# Patient Record
Sex: Male | Born: 1980 | Race: Black or African American | Hispanic: No | Marital: Single | Smoking: Current every day smoker
Health system: Southern US, Community
[De-identification: ages and names within clinical notes are randomized; demographics above are authoritative.]

## PROBLEM LIST (undated history)

## (undated) DIAGNOSIS — I10 Essential (primary) hypertension: Secondary | ICD-10-CM

---

## 2003-09-07 ENCOUNTER — Emergency Department (HOSPITAL_COMMUNITY): Admission: EM | Admit: 2003-09-07 | Discharge: 2003-09-07 | Payer: Self-pay | Admitting: Emergency Medicine

## 2008-01-07 ENCOUNTER — Emergency Department (HOSPITAL_COMMUNITY): Admission: EM | Admit: 2008-01-07 | Discharge: 2008-01-07 | Payer: Self-pay | Admitting: Emergency Medicine

## 2017-06-16 ENCOUNTER — Inpatient Hospital Stay (HOSPITAL_COMMUNITY)
Admission: EM | Admit: 2017-06-16 | Discharge: 2017-06-19 | DRG: 305 | Disposition: A | Discharge status: Home / Self Care | Payer: Self-pay | Attending: Internal Medicine | Admitting: Internal Medicine

## 2017-06-16 ENCOUNTER — Encounter (HOSPITAL_COMMUNITY): Payer: Self-pay | Admitting: Emergency Medicine

## 2017-06-16 DIAGNOSIS — Z8249 Family history of ischemic heart disease and other diseases of the circulatory system: Secondary | ICD-10-CM

## 2017-06-16 DIAGNOSIS — R7989 Other specified abnormal findings of blood chemistry: Secondary | ICD-10-CM | POA: Diagnosis present

## 2017-06-16 DIAGNOSIS — F17290 Nicotine dependence, other tobacco product, uncomplicated: Secondary | ICD-10-CM | POA: Diagnosis present

## 2017-06-16 DIAGNOSIS — N183 Chronic kidney disease, stage 3 (moderate): Secondary | ICD-10-CM | POA: Diagnosis present

## 2017-06-16 DIAGNOSIS — N390 Urinary tract infection, site not specified: Secondary | ICD-10-CM | POA: Diagnosis present

## 2017-06-16 DIAGNOSIS — R310 Gross hematuria: Secondary | ICD-10-CM | POA: Diagnosis present

## 2017-06-16 DIAGNOSIS — R319 Hematuria, unspecified: Secondary | ICD-10-CM | POA: Diagnosis present

## 2017-06-16 DIAGNOSIS — R011 Cardiac murmur, unspecified: Secondary | ICD-10-CM | POA: Diagnosis present

## 2017-06-16 DIAGNOSIS — R778 Other specified abnormalities of plasma proteins: Secondary | ICD-10-CM | POA: Diagnosis present

## 2017-06-16 DIAGNOSIS — I129 Hypertensive chronic kidney disease with stage 1 through stage 4 chronic kidney disease, or unspecified chronic kidney disease: Secondary | ICD-10-CM | POA: Diagnosis present

## 2017-06-16 DIAGNOSIS — N289 Disorder of kidney and ureter, unspecified: Secondary | ICD-10-CM

## 2017-06-16 DIAGNOSIS — I248 Other forms of acute ischemic heart disease: Secondary | ICD-10-CM | POA: Diagnosis present

## 2017-06-16 DIAGNOSIS — I161 Hypertensive emergency: Principal | ICD-10-CM | POA: Diagnosis present

## 2017-06-16 DIAGNOSIS — N179 Acute kidney failure, unspecified: Secondary | ICD-10-CM | POA: Diagnosis present

## 2017-06-16 DIAGNOSIS — E876 Hypokalemia: Secondary | ICD-10-CM | POA: Diagnosis present

## 2017-06-16 HISTORY — DX: Essential (primary) hypertension: I10

## 2017-06-16 LAB — URINALYSIS, ROUTINE W REFLEX MICROSCOPIC
Bilirubin Urine: NEGATIVE
GLUCOSE, UA: NEGATIVE mg/dL
Ketones, ur: NEGATIVE mg/dL
Leukocytes, UA: NEGATIVE
NITRITE: NEGATIVE
PH: 5 (ref 5.0–8.0)
Protein, ur: 100 mg/dL — AB
Specific Gravity, Urine: 1.009 (ref 1.005–1.030)
Squamous Epithelial / LPF: NONE SEEN

## 2017-06-16 LAB — CBC WITH DIFFERENTIAL/PLATELET
BASOS PCT: 0 %
Basophils Absolute: 0 10*3/uL (ref 0.0–0.1)
EOS ABS: 0.1 10*3/uL (ref 0.0–0.7)
Eosinophils Relative: 1 %
HEMATOCRIT: 42 % (ref 39.0–52.0)
Hemoglobin: 14.8 g/dL (ref 13.0–17.0)
LYMPHS ABS: 2.7 10*3/uL (ref 0.7–4.0)
Lymphocytes Relative: 42 %
MCH: 30.7 pg (ref 26.0–34.0)
MCHC: 35.2 g/dL (ref 30.0–36.0)
MCV: 87.1 fL (ref 78.0–100.0)
MONO ABS: 0.4 10*3/uL (ref 0.1–1.0)
MONOS PCT: 6 %
Neutro Abs: 3.3 10*3/uL (ref 1.7–7.7)
Neutrophils Relative %: 51 %
Platelets: 216 10*3/uL (ref 150–400)
RBC: 4.82 MIL/uL (ref 4.22–5.81)
RDW: 12.8 % (ref 11.5–15.5)
WBC: 6.5 10*3/uL (ref 4.0–10.5)

## 2017-06-16 MED ORDER — NITROGLYCERIN IN D5W 200-5 MCG/ML-% IV SOLN
0.0000 ug/min | Freq: Once | INTRAVENOUS | Status: AC
  Administered 2017-06-17: 5 ug/min via INTRAVENOUS
  Filled 2017-06-16: qty 250

## 2017-06-16 MED ORDER — CLONIDINE HCL 0.1 MG PO TABS
0.2000 mg | ORAL_TABLET | Freq: Once | ORAL | Status: AC
  Administered 2017-06-16: 0.2 mg via ORAL
  Filled 2017-06-16: qty 2

## 2017-06-16 MED ORDER — HYDRALAZINE HCL 20 MG/ML IJ SOLN
10.0000 mg | Freq: Once | INTRAMUSCULAR | Status: AC
  Administered 2017-06-17: 10 mg via INTRAVENOUS
  Filled 2017-06-16: qty 1

## 2017-06-16 NOTE — ED Triage Notes (Addendum)
Patient c/o one episode of bleeding from penis today. Reports noticing blood on his boxers. Patient hypertensive in triage 244/173. Hx of same. Denies taking BP medications. Denies headache, CP, and SOB.

## 2017-06-16 NOTE — ED Provider Notes (Signed)
[facility]-EMERGENCY DEPT Provider Note   CSN: [id number] Arrival date & time: 06/16/17  2143     History   Chief Complaint Chief Complaint  Patient presents with  . Hypertension  . Penile Discharge    HPI Tyler Gilmore is a 36 y.o. male.  HPI Tyler Gilmore is a 36 y.o. male with history of hypertension, presents to emergency department complaining complaining of bleeding from the penis.  Patient states he has been noticing bright red blood in his boxers mainly in the morning over the last several weeks.  He states he also has some bloody urine when he initially starts to urinate, but then he states it clears up.  He reports some associated clear discharge.  He denies any pain to his penis or his scrotum.  He denies any injuries.  No trauma.  He denies any fever or chills.  He does report intermittent headaches.  Blood pressure was found to be 244/173 with elevated heart rate of 111 in triage.  No any associated symptoms at this time otherwise. Pt reports being told his BP was high 3 years ago, state was give prescription for BP meds which he took for a month but then ran out and never followed up. Does not have PCP. States has been checking BP at home with his own BP machine and it is usually 180s systolic.   Past Medical History:  Diagnosis Date  . Hypertension     There are no active problems to display for this patient.        Home Medications    Prior to Admission medications   Not on File    Family History No family history on file.  Social History Social History   Tobacco Use  . Smoking status: Not on file  Substance Use Topics  . Alcohol use: Not on file  . Drug use: Not on file     Allergies   Patient has no known allergies.   Review of Systems Review of Systems  Constitutional: Negative for chills and fever.  Respiratory: Negative for cough, chest tightness and shortness of breath.   Cardiovascular: Negative for chest  pain, palpitations and leg swelling.  Gastrointestinal: Negative for abdominal distention, abdominal pain, diarrhea, nausea and vomiting.  Genitourinary: Positive for discharge and hematuria. Negative for difficulty urinating, dysuria, frequency, penile pain, penile swelling, scrotal swelling, testicular pain and urgency.  Musculoskeletal: Negative for arthralgias, myalgias, neck pain and neck stiffness.  Skin: Negative for rash.  Allergic/Immunologic: Negative for immunocompromised state.  Neurological: Positive for headaches. Negative for dizziness, weakness, light-headedness and numbness.     Physical Exam Updated Vital Signs BP (!) 244/173 (BP Location: Left Arm)   Pulse (!) 111   Temp 98 F (36.7 C) (Oral)   Resp 18   SpO2 99%   Physical Exam  Constitutional: He appears well-developed and well-nourished. No distress.  HENT:  Head: Normocephalic and atraumatic.  Eyes: Conjunctivae are normal.  Neck: Neck supple.  Cardiovascular: Regular rhythm and normal heart sounds.  tachycardic  Pulmonary/Chest: Effort normal. No respiratory distress. He has no wheezes. He has no rales.  Abdominal: Soft. Bowel sounds are normal. He exhibits no distension. There is no tenderness. There is no rebound.  Musculoskeletal: He exhibits no edema.  Neurological: He is alert.  Skin: Skin is warm and dry.  Nursing note and vitals reviewed.    ED Treatments / Results  Labs (all labs ordered are listed, but only abnormal results  are displayed) Labs Reviewed  URINALYSIS, ROUTINE W REFLEX MICROSCOPIC - Abnormal; Notable for the following components:      Result Value   Color, Urine STRAW (*)    Hgb urine dipstick LARGE (*)    Protein, ur 100 (*)    Bacteria, UA RARE (*)    All other components within normal limits  COMPREHENSIVE METABOLIC PANEL - Abnormal; Notable for the following components:   Potassium 2.7 (*)    Chloride 100 (*)    BUN 31 (*)    Creatinine, Ser 2.77 (*)    GFR calc non  Af Amer 27 (*)    GFR calc Af Amer 32 (*)    All other components within normal limits  TROPONIN I - Abnormal; Notable for the following components:   Troponin I 0.06 (*)    All other components within normal limits  MRSA PCR SCREENING  CBC WITH DIFFERENTIAL/PLATELET  MAGNESIUM  PHOSPHORUS  TROPONIN I  TROPONIN I  TROPONIN I  CBC WITH DIFFERENTIAL/PLATELET  BASIC METABOLIC PANEL  GC/CHLAMYDIA PROBE AMP ([place]) NOT AT [facility]    EKG  EKG Interpretation  Date/Time:  Tuesday June 16 2017 23:48:10 EST Ventricular Rate:  88 PR Interval:    QRS Duration: 103 QT Interval:  378 QTC Calculation: 458 R Axis:   78 Text Interpretation:  Sinus rhythm Biatrial enlargement Left ventricular hypertrophy Abnormal T, consider ischemia, inferior leads Baseline wander in lead(s) V3 No old tracing to compare Confirmed by Rochele RaringWard, Kristen [phone]([id number]) on 06/16/2017 11:51:10 PM       Radiology Dg Chest 2 View  Result Date: 06/17/2017 CLINICAL DATA:  High blood pressure.  Nonsmoker. EXAM: CHEST  2 VIEW COMPARISON:  None. FINDINGS: The heart size and mediastinal contours are within normal limits. Both lungs are clear. The visualized skeletal structures are unremarkable. IMPRESSION: No active cardiopulmonary disease. Electronically Signed   By: Burman NievesWilliam  Stevens M.D.   On: 06/17/2017 00:47    Procedures Procedures (including critical care time)  Medications Ordered in ED Medications  cloNIDine (CATAPRES) tablet 0.2 mg (not administered)     Initial Impression / Assessment and Plan / ED Course  I have reviewed the triage vital signs and the nursing notes.  Pertinent labs & imaging results that were available during my care of the patient were reviewed by me and considered in my medical decision making (see chart for details).     Patient with elevated blood pressure, her blood pressure is 240 systolic.  He is tachycardic.  He is otherwise nontoxic appearing.  Only complaint is intermittent  headaches and hematuria.  Will check basic labs, EKG, urinalysis.  Will treat with clonidine for now.  Patient is EKG with T wave inversions and ST depressions.  Given EKG changes in the last EKG to compare to, will add troponin and will start on IV hydralazine and nitroglycerin drip.  Patient's blood pressure continues to be 240s systolic.   Urinalysis with hematuria, does not appear to be infected.  Creatinine is 2.77.  No last to compare.  Potassium 2.7.  Added potassium p.o. and IV.  Will admit for hypertensive urgency.   Vitals:   06/17/17 0300 06/17/17 0400 06/17/17 0500 06/17/17 0600  BP: (!) 171/122 (!) 156/110 (!) 147/92 (!) 152/109  Pulse: 86 87 88 90  Resp: (!) 28 16 15 15   Temp:      TempSrc:      SpO2: 93% 93% 94% 93%  Weight:      Height:  Final Clinical Impressions(s) / ED Diagnoses   Final diagnoses:  Hypertensive emergency    ED Discharge Orders    None       Jaynie Crumble, PA-C 06/17/17 1610    Ward, Layla Maw, DO 06/17/17 [phone]

## 2017-06-17 ENCOUNTER — Emergency Department (HOSPITAL_COMMUNITY): Payer: Self-pay

## 2017-06-17 ENCOUNTER — Inpatient Hospital Stay (HOSPITAL_COMMUNITY): Payer: Self-pay

## 2017-06-17 ENCOUNTER — Other Ambulatory Visit: Payer: Self-pay

## 2017-06-17 ENCOUNTER — Encounter (HOSPITAL_COMMUNITY): Payer: Self-pay | Admitting: Internal Medicine

## 2017-06-17 DIAGNOSIS — N289 Disorder of kidney and ureter, unspecified: Secondary | ICD-10-CM

## 2017-06-17 DIAGNOSIS — R778 Other specified abnormalities of plasma proteins: Secondary | ICD-10-CM | POA: Diagnosis present

## 2017-06-17 DIAGNOSIS — E876 Hypokalemia: Secondary | ICD-10-CM | POA: Diagnosis present

## 2017-06-17 DIAGNOSIS — I361 Nonrheumatic tricuspid (valve) insufficiency: Secondary | ICD-10-CM

## 2017-06-17 DIAGNOSIS — R7989 Other specified abnormal findings of blood chemistry: Secondary | ICD-10-CM

## 2017-06-17 DIAGNOSIS — R011 Cardiac murmur, unspecified: Secondary | ICD-10-CM | POA: Diagnosis present

## 2017-06-17 DIAGNOSIS — I161 Hypertensive emergency: Principal | ICD-10-CM

## 2017-06-17 DIAGNOSIS — R319 Hematuria, unspecified: Secondary | ICD-10-CM | POA: Diagnosis present

## 2017-06-17 DIAGNOSIS — R748 Abnormal levels of other serum enzymes: Secondary | ICD-10-CM

## 2017-06-17 LAB — ECHOCARDIOGRAM COMPLETE
Height: 66 in
Weight: 3068.8 oz

## 2017-06-17 LAB — GC/CHLAMYDIA PROBE AMP (~~LOC~~) NOT AT ARMC
Chlamydia: POSITIVE — AB
Neisseria Gonorrhea: NEGATIVE

## 2017-06-17 LAB — CBC WITH DIFFERENTIAL/PLATELET
Basophils Absolute: 0 10*3/uL (ref 0.0–0.1)
Basophils Relative: 0 %
EOS ABS: 0 10*3/uL (ref 0.0–0.7)
EOS PCT: 0 %
HCT: 35.7 % — ABNORMAL LOW (ref 39.0–52.0)
Hemoglobin: 12 g/dL — ABNORMAL LOW (ref 13.0–17.0)
LYMPHS ABS: 0.9 10*3/uL (ref 0.7–4.0)
Lymphocytes Relative: 12 %
MCH: 29.6 pg (ref 26.0–34.0)
MCHC: 33.6 g/dL (ref 30.0–36.0)
MCV: 87.9 fL (ref 78.0–100.0)
MONOS PCT: 5 %
Monocytes Absolute: 0.3 10*3/uL (ref 0.1–1.0)
Neutro Abs: 6 10*3/uL (ref 1.7–7.7)
Neutrophils Relative %: 83 %
PLATELETS: 195 10*3/uL (ref 150–400)
RBC: 4.06 MIL/uL — AB (ref 4.22–5.81)
RDW: 12.9 % (ref 11.5–15.5)
WBC: 7.2 10*3/uL (ref 4.0–10.5)

## 2017-06-17 LAB — BASIC METABOLIC PANEL
Anion gap: 7 (ref 5–15)
BUN: 31 mg/dL — AB (ref 6–20)
CALCIUM: 8.6 mg/dL — AB (ref 8.9–10.3)
CO2: 24 mmol/L (ref 22–32)
CREATININE: 2.7 mg/dL — AB (ref 0.61–1.24)
Chloride: 103 mmol/L (ref 101–111)
GFR calc Af Amer: 33 mL/min — ABNORMAL LOW (ref >60)
GFR, EST NON AFRICAN AMERICAN: 28 mL/min — AB (ref >60)
Glucose, Bld: 140 mg/dL — ABNORMAL HIGH (ref 65–99)
Potassium: 3.5 mmol/L (ref 3.5–5.1)
SODIUM: 134 mmol/L — AB (ref 135–145)

## 2017-06-17 LAB — COMPREHENSIVE METABOLIC PANEL
ALBUMIN: 3.9 g/dL (ref 3.5–5.0)
ALT: 28 U/L (ref 17–63)
ANION GAP: 12 (ref 5–15)
AST: 34 U/L (ref 15–41)
Alkaline Phosphatase: 69 U/L (ref 38–126)
BUN: 31 mg/dL — ABNORMAL HIGH (ref 6–20)
CO2: 23 mmol/L (ref 22–32)
Calcium: 9.2 mg/dL (ref 8.9–10.3)
Chloride: 100 mmol/L — ABNORMAL LOW (ref 101–111)
Creatinine, Ser: 2.77 mg/dL — ABNORMAL HIGH (ref 0.61–1.24)
GFR calc Af Amer: 32 mL/min — ABNORMAL LOW (ref >60)
GFR calc non Af Amer: 27 mL/min — ABNORMAL LOW (ref >60)
GLUCOSE: 87 mg/dL (ref 65–99)
POTASSIUM: 2.7 mmol/L — AB (ref 3.5–5.1)
SODIUM: 135 mmol/L (ref 135–145)
Total Bilirubin: 0.4 mg/dL (ref 0.3–1.2)
Total Protein: 7.3 g/dL (ref 6.5–8.1)

## 2017-06-17 LAB — MRSA PCR SCREENING: MRSA BY PCR: NEGATIVE

## 2017-06-17 LAB — TROPONIN I
TROPONIN I: 0.06 ng/mL — AB (ref <0.03)
Troponin I: 0.06 ng/mL (ref <0.03)
Troponin I: 0.09 ng/mL (ref <0.03)

## 2017-06-17 LAB — SEDIMENTATION RATE: Sed Rate: 9 mm/hr (ref 0–16)

## 2017-06-17 LAB — MAGNESIUM: MAGNESIUM: 2.2 mg/dL (ref 1.7–2.4)

## 2017-06-17 LAB — PHOSPHORUS: PHOSPHORUS: 3.3 mg/dL (ref 2.5–4.6)

## 2017-06-17 MED ORDER — ALPRAZOLAM 0.5 MG PO TABS: 0.5000 mg | ORAL_TABLET | Freq: Every evening | ORAL | Status: DC | PRN

## 2017-06-17 MED ORDER — ONDANSETRON HCL 4 MG/2ML IJ SOLN: 4.0000 mg | Freq: Four times a day (QID) | INTRAMUSCULAR | Status: DC | PRN

## 2017-06-17 MED ORDER — CLONIDINE HCL 0.1 MG PO TABS
0.2000 mg | ORAL_TABLET | Freq: Two times a day (BID) | ORAL | Status: DC
  Administered 2017-06-17 – 2017-06-18 (×3): 0.2 mg via ORAL
  Filled 2017-06-17 (×3): qty 2

## 2017-06-17 MED ORDER — AMLODIPINE BESYLATE 10 MG PO TABS
10.0000 mg | ORAL_TABLET | Freq: Every day | ORAL | Status: DC
  Administered 2017-06-17 – 2017-06-19 (×3): 10 mg via ORAL
  Filled 2017-06-17 (×3): qty 1

## 2017-06-17 MED ORDER — HYDRALAZINE HCL 20 MG/ML IJ SOLN
10.0000 mg | Freq: Four times a day (QID) | INTRAMUSCULAR | Status: DC | PRN
  Administered 2017-06-18 – 2017-06-19 (×4): 10 mg via INTRAVENOUS
  Filled 2017-06-17 (×4): qty 1

## 2017-06-17 MED ORDER — POTASSIUM CHLORIDE 10 MEQ/100ML IV SOLN
10.0000 meq | INTRAVENOUS | Status: AC
  Administered 2017-06-17 (×2): 10 meq via INTRAVENOUS
  Filled 2017-06-17 (×2): qty 100

## 2017-06-17 MED ORDER — SPIRONOLACTONE 25 MG PO TABS
25.0000 mg | ORAL_TABLET | Freq: Two times a day (BID) | ORAL | Status: DC
  Administered 2017-06-17 – 2017-06-19 (×4): 25 mg via ORAL
  Filled 2017-06-17 (×4): qty 1

## 2017-06-17 MED ORDER — ONDANSETRON HCL 4 MG PO TABS: 4.0000 mg | ORAL_TABLET | Freq: Four times a day (QID) | ORAL | Status: DC | PRN

## 2017-06-17 MED ORDER — POTASSIUM CHLORIDE CRYS ER 20 MEQ PO TBCR
40.0000 meq | EXTENDED_RELEASE_TABLET | Freq: Once | ORAL | Status: AC
  Administered 2017-06-17: 40 meq via ORAL
  Filled 2017-06-17: qty 2

## 2017-06-17 MED ORDER — NITROGLYCERIN IN D5W 200-5 MCG/ML-% IV SOLN: 0.0000 ug/min | Freq: Once | INTRAVENOUS | Status: DC

## 2017-06-17 MED ORDER — ASPIRIN EC 325 MG PO TBEC
325.0000 mg | DELAYED_RELEASE_TABLET | Freq: Once | ORAL | Status: AC
  Administered 2017-06-17: 325 mg via ORAL
  Filled 2017-06-17: qty 1

## 2017-06-17 NOTE — ED Notes (Signed)
New room 1237

## 2017-06-17 NOTE — Progress Notes (Signed)
PROGRESS NOTE    Tyler Gilmore  WUJ:[id number] DOB: 03/17/1981 DOA: 06/16/2017 PCP: Patient, No Pcp Per  Brief Narrative:Trek Tyler Gilmore is a 36 y.o. male with medical history significant of hypertension, diagnosed 3 years ago and not on medical therapy who comes in the emergency department complaining of bloody discharge from his urethra seen earlier today and also has noticed bright red blood in his urine for the past 2-3 weeks. In ED, blood pressure 244/173 mmHg, workup shows a urinalysis with large hematuria, proteinuria over 100 mg/dL and creatinine 9.562.77 mg/dL. EKG showed LVH  Assessment & Plan:     Hypertensive emergency - Patient has long-standing uncontrolled hypertension, for at least 3 years -Now off nitro drip -Add oral amlodipine, and clonidine, continue when necessary IV hydralazine -Echocardiogram with normal ejection fraction and elevated end-diastolic and systolic pressures   Hematuria -Etiology unclear, could possibly be due to hypertensive emergency and progression of kidney disease -Nephritic component not excluded -Will ask renal for input -Renal ultrasound suggestive of medical renal disease no structural abnormalities to account for hematuria   Progressive AKI -His creatinine [facility] 1.86 [facility] -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 at least due to uncontrolled hypertension -Monitor creatinine   Elevated troponin -Due to increased demand from hypertensive emergency -Troponin flat trend, no evidence of ACS -2-D echocardiogram with normal ejection fraction and wall motion-  DVT prophylaxis: SCDs due to hematuria Code Status: Full code Family Communication: Friend at bedside Disposition Plan: Home in 1-2 days of stable  Consultants:   Nephrology   Procedures:   Antimicrobials:    Subjective: -Feels okay, mild headache  Objective: Vitals:   06/17/17 0800 06/17/17 0807 06/17/17  0809 06/17/17 0900  BP: (!) 174/112 (!) 191/126 (!) 187/119 (!) 172/95  Pulse: 93 91 88 98  Resp:  18 (!) 22 (!) 21  Temp:      TempSrc:      SpO2: 95% 96% 94% 94%  Weight:      Height:        Intake/Output Summary (Last 24 hours) at 06/17/2017 1150 Last data filed at 06/17/2017 0826 Gross per 24 hour  Intake 338 ml  Output 550 ml  Net -212 ml   Filed Weights   06/17/17 0118 06/17/17 0227  Weight: 87.1 kg (192 lb) 87 kg (191 lb 12.8 oz)    Examination:  General exam: Appears calm and comfortable, well-built male, no distress Respiratory system: Clear to auscultation. Respiratory effort normal. Cardiovascular system: S1 & S2 heard, RRR. Systolic murmur appreciated Gastrointestinal system: Abdomen is nondistended, soft and nontender.Normal bowel sounds heard. Central nervous system: Alert and oriented. No focal neurological deficits. Extremities: Symmetric 5 x 5 power. Skin: No rashes, lesions or ulcers Psychiatry: Judgement and insight appear normal. Mood & affect appropriate.     Data Reviewed:   CBC: Recent Labs  Lab 06/16/17 2332 06/17/17 1125  WBC 6.5 7.2  NEUTROABS 3.3 6.0  HGB 14.8 12.0*  HCT 42.0 35.7*  MCV 87.1 87.9  PLT 216 195   Basic Metabolic Panel: Recent Labs  Lab 06/16/17 2332 06/16/17 2342  NA 135  --   K 2.7*  --   CL 100*  --   CO2 23  --   GLUCOSE 87  --   BUN 31*  --   CREATININE 2.77*  --   CALCIUM 9.2  --   MG  --  2.2  PHOS  --  3.3   GFR: Estimated Creatinine Clearance: 37.4 mL/min (A) (by C-G formula based on SCr of 2.77 mg/dL (H)). Liver Function Tests: Recent Labs  Lab 06/16/17 2332  AST 34  ALT 28  ALKPHOS 69  BILITOT 0.4  PROT 7.3  ALBUMIN 3.9   No results for input(s): LIPASE, AMYLASE in the last 168 hours. No results for input(s): AMMONIA in the last 168 hours. Coagulation Profile: No results for input(s): INR, PROTIME in the last 168 hours. Cardiac Enzymes: Recent Labs  Lab 06/16/17 2330  06/17/17 0619  TROPONINI 0.06* 0.06*   BNP (last 3 results) No results for input(s): PROBNP in the last 8760 hours. HbA1C: No results for input(s): HGBA1C in the last 72 hours. CBG: No results for input(s): GLUCAP in the last 168 hours. Lipid Profile: No results for input(s): CHOL, HDL, LDLCALC, TRIG, CHOLHDL, LDLDIRECT in the last 72 hours. Thyroid Function Tests: No results for input(s): TSH, T4TOTAL, FREET4, T3FREE, THYROIDAB in the last 72 hours. Anemia Panel: No results for input(s): VITAMINB12, FOLATE, FERRITIN, TIBC, IRON, RETICCTPCT in the last 72 hours. Urine analysis:    Component Value Date/Time   COLORURINE STRAW (A) 06/16/2017 2332   APPEARANCEUR CLEAR 06/16/2017 2332   LABSPEC 1.009 06/16/2017 2332   PHURINE 5.0 06/16/2017 2332   GLUCOSEU NEGATIVE 06/16/2017 2332   HGBUR LARGE (A) 06/16/2017 2332   BILIRUBINUR NEGATIVE 06/16/2017 2332   KETONESUR NEGATIVE 06/16/2017 2332   PROTEINUR 100 (A) 06/16/2017 2332   NITRITE NEGATIVE 06/16/2017 2332   LEUKOCYTESUR NEGATIVE 06/16/2017 2332   Sepsis Labs: @LABRCNTIP (procalcitonin:4,lacticidven:4)  ) Recent Results (from the past 240 hour(s))  MRSA PCR Screening     Status: None   Collection Time: 06/17/17  2:21 AM  Result Value Ref Range Status   MRSA by PCR NEGATIVE NEGATIVE Final    Comment:        The GeneXpert MRSA Assay (FDA approved for NASAL specimens only), is one component of a comprehensive MRSA colonization surveillance program. It is not intended to diagnose MRSA infection nor to guide or monitor treatment for MRSA infections.          Radiology Studies: Dg Chest 2 View  Result Date: 06/17/2017 CLINICAL DATA:  High blood pressure.  Nonsmoker. EXAM: CHEST  2 VIEW COMPARISON:  None. FINDINGS: The heart size and mediastinal contours are within normal limits. Both lungs are clear. The visualized skeletal structures are unremarkable. IMPRESSION: No active cardiopulmonary disease. Electronically  Signed   By: Burman NievesWilliam  Stevens M.D.   On: 06/17/2017 00:47   [place] Renal  Result Date: 06/17/2017 CLINICAL DATA:  Abnormal renal function. EXAM: RENAL / URINARY TRACT ULTRASOUND COMPLETE COMPARISON:  None. FINDINGS: Right Kidney: Length: 10.4 cm. Increased echogenicity of renal parenchyma is noted. No mass or hydronephrosis visualized. Left Kidney: Length: 10.4 cm. Increased echogenicity of renal parenchyma is noted. No mass or hydronephrosis visualized. Bladder: Appears normal for degree of bladder distention. IMPRESSION: Increased echogenicity of renal parenchyma is noted suggesting medical renal disease. No hydronephrosis or renal obstruction is noted. Electronically Signed   By: Lupita RaiderJames  Green Jr, M.D.   On: 06/17/2017 09:26        Scheduled Meds: . amLODipine  10 mg Oral Daily   Continuous Infusions:   LOS: 0 days    Time spent: 35min    Zannie CovePreetha Ameliarose Shark, MD [facility] Page via [email or web address], password [email or web address] After 7PM please contact night-coverage  06/17/2017, 11:50 AM

## 2017-06-17 NOTE — Progress Notes (Signed)
  Echocardiogram 2D Echocardiogram has been performed.  Leta JunglingCooper, [place] M 06/17/2017, 9:10 AM

## 2017-06-17 NOTE — Plan of Care (Signed)
  Progressing Education: Knowledge of General Education information will improve 06/17/2017 1855 - Progressing by Burna SisZavaleta Catalan, Sanvi Ehler G, RN Health Behavior/Discharge Planning: Ability to manage health-related needs will improve 06/17/2017 1855 - Progressing by Burna SisZavaleta Catalan, Haliegh Khurana G, RN Clinical Measurements: Ability to maintain clinical measurements within normal limits will improve 06/17/2017 1855 - Progressing by Burna SisZavaleta Catalan, Chidinma Clites G, RN Will remain free from infection 06/17/2017 1855 - Progressing by Burna SisZavaleta Catalan, Whitt Auletta G, RN Respiratory complications will improve 06/17/2017 1855 - Progressing by Burna SisZavaleta Catalan, Taylour Lietzke G, RN Cardiovascular complication will be avoided 06/17/2017 1855 - Progressing by Burna SisZavaleta Catalan, Ramone Gander G, RN Activity: Risk for activity intolerance will decrease 06/17/2017 1855 - Progressing by Burna SisZavaleta Catalan, Jamesyn Lindell G, RN Nutrition: Adequate nutrition will be maintained 06/17/2017 1855 - Progressing by Burna SisZavaleta Catalan, Jacques Fife G, RN Coping: Level of anxiety will decrease 06/17/2017 1855 - Progressing by Burna SisZavaleta Catalan, Ladonya Jerkins G, RN Elimination: Will not experience complications related to bowel motility 06/17/2017 1855 - Progressing by Burna SisZavaleta Catalan, Kanan Sobek G, RN Will not experience complications related to urinary retention 06/17/2017 1855 - Progressing by Burna SisZavaleta Catalan, Mishayla Sliwinski G, RN Pain Managment: General experience of comfort will improve 06/17/2017 1855 - Progressing by Burna SisZavaleta Catalan, Aidynn Polendo G, RN Safety: Ability to remain free from injury will improve 06/17/2017 1855 - Progressing by Burna SisZavaleta Catalan, Kaylem Gidney G, RN Skin Integrity: Risk for impaired skin integrity will decrease 06/17/2017 1855 - Progressing by Burna SisZavaleta Catalan, Rima Blizzard G, RN   Not Progressing Clinical Measurements: Diagnostic test results will improve 06/17/2017 1855 - Not Progressing by Burna SisZavaleta Catalan, Christabelle Hanzlik G, RN

## 2017-06-17 NOTE — Consult Note (Signed)
[facility] Renal Consultation Note  Requesting MD: Fanny Bien Indication for Consultation: elevated creatinine and gross hematuria   HPI:  Tyler Gilmore is a 36 y.o. male with past medical history significant for hypertension. Apparently, diagnosed about 3 years ago but h a well as not been on consistent medical therapy mostly due to financial reasons. Also of note, patient was noted to have a creatinine of 1.86 with an seen in in the emergency room in July 2015. He presented to the hospital last night with complaint of gross hematuria which he states he's had for the last 2-3 weeks. Stream starting with bright red blood then it fades. He has never noticed this before. There is no family history of kidney disease that he knows of. He tells me that he's had daily headaches and takes BC's (goody powders)  daily and thinks that he has done so  for the last 8 years !  His presenting creatinine was 2.77, 2.7 approximately 12 hours later. Potassium also was low at 2.7. Renal ultrasound shows 10.4 cm kidneys, increased echogenicity, no evidence of stone or Hydro.  UA with too numerous to count red, 100 protein.  His serum albumin is 3.9 and he has no edema.  Other than the gross hematuria and headaches he has been well  Creatinine, Ser  Date/Time Value Ref Range Status  06/17/2017 11:25 AM 2.70 (H) 0.61 - 1.24 mg/dL Final  06/16/2017 11:32 PM 2.77 (H) 0.61 - 1.24 mg/dL Final     PMHx:   Past Medical History:  Diagnosis Date  . Hypertension     History reviewed. No pertinent surgical history.  Family Hx:  Family History  Problem Relation Age of Onset  . Hypertension Mother   . Hypertension Maternal Grandmother   . Hypertension Maternal Aunt     Social History:  reports that he has been smoking cigars.  he has never used smokeless tobacco. He reports that he drinks about 0.6 oz of alcohol per week. His drug history is not on file.  Allergies: No Known  Allergies  Medications: Prior to Admission medications   Medication Sig Start Date End Date Taking? Authorizing Provider  Aspirin-Salicylamide-Caffeine (BC HEADACHE POWDER PO) Take 1 packet by mouth daily as needed (headache).   Yes [provider]  ibuprofen (ADVIL,MOTRIN) 200 MG tablet Take 400 mg by mouth every 8 (eight) hours as needed for headache.   Yes [provider]    I have reviewed the patient's current medications.  Labs:  Results for orders placed or performed during the hospital encounter of 06/16/17 (from the past 48 hour(s))  Troponin I     Status: Abnormal   Collection Time: 06/16/17 11:30 PM  Result Value Ref Range   Troponin I 0.06 (HH) <0.03 ng/mL    Comment: CRITICAL RESULT CALLED TO, READ BACK BY AND VERIFIED WITH: NASH,J RN 11.21.18 @0023  ZANDO,C   Urinalysis, Routine w reflex microscopic     Status: Abnormal   Collection Time: 06/16/17 11:32 PM  Result Value Ref Range   Color, Urine STRAW (A) YELLOW   APPearance CLEAR CLEAR   Specific Gravity, Urine 1.009 1.005 - 1.030   pH 5.0 5.0 - 8.0   Glucose, UA NEGATIVE NEGATIVE mg/dL   Hgb urine dipstick LARGE (A) NEGATIVE   Bilirubin Urine NEGATIVE NEGATIVE   Ketones, ur NEGATIVE NEGATIVE mg/dL   Protein, ur 100 (A) NEGATIVE mg/dL   Nitrite NEGATIVE NEGATIVE   Leukocytes, UA NEGATIVE NEGATIVE   RBC /  HPF TOO NUMEROUS TO COUNT 0 - 5 RBC/hpf   WBC, UA 6-30 0 - 5 WBC/hpf   Bacteria, UA RARE (A) NONE SEEN   Squamous Epithelial / LPF NONE SEEN NONE SEEN   Mucus PRESENT   CBC with Differential     Status: None   Collection Time: 06/16/17 11:32 PM  Result Value Ref Range   WBC 6.5 4.0 - 10.5 K/uL   RBC 4.82 4.22 - 5.81 MIL/uL   Hemoglobin 14.8 13.0 - 17.0 g/dL   HCT 42.0 39.0 - 52.0 %   MCV 87.1 78.0 - 100.0 fL   MCH 30.7 26.0 - 34.0 pg   MCHC 35.2 30.0 - 36.0 g/dL   RDW 12.8 11.5 - 15.5 %   Platelets 216 150 - 400 K/uL   Neutrophils Relative % 51 %   Neutro Abs 3.3 1.7 - 7.7 K/uL    Lymphocytes Relative 42 %   Lymphs Abs 2.7 0.7 - 4.0 K/uL   Monocytes Relative 6 %   Monocytes Absolute 0.4 0.1 - 1.0 K/uL   Eosinophils Relative 1 %   Eosinophils Absolute 0.1 0.0 - 0.7 K/uL   Basophils Relative 0 %   Basophils Absolute 0.0 0.0 - 0.1 K/uL  Comprehensive metabolic panel     Status: Abnormal   Collection Time: 06/16/17 11:32 PM  Result Value Ref Range   Sodium 135 135 - 145 mmol/L   Potassium 2.7 (LL) 3.5 - 5.1 mmol/L    Comment: REPEATED TO VERIFY CRITICAL RESULT CALLED TO, READ BACK BY AND VERIFIED WITH: NASH,J RN 11.21.18 @0024  ZANDO,C    Chloride 100 (L) 101 - 111 mmol/L   CO2 23 22 - 32 mmol/L   Glucose, Bld 87 65 - 99 mg/dL   BUN 31 (H) 6 - 20 mg/dL   Creatinine, Ser 2.77 (H) 0.61 - 1.24 mg/dL   Calcium 9.2 8.9 - 10.3 mg/dL   Total Protein 7.3 6.5 - 8.1 g/dL   Albumin 3.9 3.5 - 5.0 g/dL   AST 34 15 - 41 U/L   ALT 28 17 - 63 U/L   Alkaline Phosphatase 69 38 - 126 U/L   Total Bilirubin 0.4 0.3 - 1.2 mg/dL   GFR calc non Af Amer 27 (L) >60 mL/min   GFR calc Af Amer 32 (L) >60 mL/min    Comment: (NOTE) The eGFR has been calculated using the CKD EPI equation. This calculation has not been validated in all clinical situations. eGFR's persistently <60 mL/min signify possible Chronic Kidney Disease.    Anion gap 12 5 - 15  Magnesium     Status: None   Collection Time: 06/16/17 11:42 PM  Result Value Ref Range   Magnesium 2.2 1.7 - 2.4 mg/dL  Phosphorus     Status: None   Collection Time: 06/16/17 11:42 PM  Result Value Ref Range   Phosphorus 3.3 2.5 - 4.6 mg/dL  MRSA PCR Screening     Status: None   Collection Time: 06/17/17  2:21 AM  Result Value Ref Range   MRSA by PCR NEGATIVE NEGATIVE    Comment:        The GeneXpert MRSA Assay (FDA approved for NASAL specimens only), is one component of a comprehensive MRSA colonization surveillance program. It is not intended to diagnose MRSA infection nor to guide or monitor treatment for MRSA  infections.   Troponin I (q 6hr x 3)     Status: Abnormal   Collection Time: 06/17/17  6:19 AM  Result Value Ref Range   Troponin I 0.06 (HH) <0.03 ng/mL    Comment: CRITICAL VALUE NOTED.  VALUE IS CONSISTENT WITH PREVIOUSLY REPORTED AND CALLED VALUE.  Troponin I (q 6hr x 3)     Status: Abnormal   Collection Time: 06/17/17 11:25 AM  Result Value Ref Range   Troponin I 0.09 (HH) <0.03 ng/mL    Comment: CRITICAL VALUE NOTED.  VALUE IS CONSISTENT WITH PREVIOUSLY REPORTED AND CALLED VALUE.  CBC WITH DIFFERENTIAL     Status: Abnormal   Collection Time: 06/17/17 11:25 AM  Result Value Ref Range   WBC 7.2 4.0 - 10.5 K/uL   RBC 4.06 (L) 4.22 - 5.81 MIL/uL   Hemoglobin 12.0 (L) 13.0 - 17.0 g/dL   HCT 35.7 (L) 39.0 - 52.0 %   MCV 87.9 78.0 - 100.0 fL   MCH 29.6 26.0 - 34.0 pg   MCHC 33.6 30.0 - 36.0 g/dL   RDW 12.9 11.5 - 15.5 %   Platelets 195 150 - 400 K/uL   Neutrophils Relative % 83 %   Neutro Abs 6.0 1.7 - 7.7 K/uL   Lymphocytes Relative 12 %   Lymphs Abs 0.9 0.7 - 4.0 K/uL   Monocytes Relative 5 %   Monocytes Absolute 0.3 0.1 - 1.0 K/uL   Eosinophils Relative 0 %   Eosinophils Absolute 0.0 0.0 - 0.7 K/uL   Basophils Relative 0 %   Basophils Absolute 0.0 0.0 - 0.1 K/uL  Basic metabolic panel     Status: Abnormal   Collection Time: 06/17/17 11:25 AM  Result Value Ref Range   Sodium 134 (L) 135 - 145 mmol/L   Potassium 3.5 3.5 - 5.1 mmol/L    Comment: DELTA CHECK NOTED NO VISIBLE HEMOLYSIS REPEATED TO VERIFY    Chloride 103 101 - 111 mmol/L   CO2 24 22 - 32 mmol/L   Glucose, Bld 140 (H) 65 - 99 mg/dL   BUN 31 (H) 6 - 20 mg/dL   Creatinine, Ser 2.70 (H) 0.61 - 1.24 mg/dL   Calcium 8.6 (L) 8.9 - 10.3 mg/dL   GFR calc non Af Amer 28 (L) >60 mL/min   GFR calc Af Amer 33 (L) >60 mL/min    Comment: (NOTE) The eGFR has been calculated using the CKD EPI equation. This calculation has not been validated in all clinical situations. eGFR's persistently <60 mL/min signify possible  Chronic Kidney Disease.    Anion gap 7 5 - 15     ROS:  A comprehensive review of systems was negative except for: Genitourinary: positive for hematuria Neurological: positive for headaches  Physical Exam: Vitals:   06/17/17 1153 06/17/17 1200  BP: (!) 184/119 (!) 174/100  Pulse:  90  Resp:  (!) 22  Temp:  98 F (36.7 C)  SpO2:  94%     General: Well-appearing, well-developed black male eating lunch and in no acute distress HEENT: Pupils are equally round and reactive to light, and shock liver motions are intact, mucous members are moist Neck: No JVD Heart: Regular rate and rhythm Lungs: Clear to auscultation bilaterally Abdomen: Soft, nontender, nondistended. No flank pain Extremities: No peripheral edema Skin: Warm and dry Neuro: Alert and nonfocal  Assessment/Plan: 36 year old black male with history of creatinine 1.8  4 years ago, now 2.7. He presents with gross hematuria and hypertensive urgency 1. Elevated creatinine- this could be as simple as progressive CKD in the setting of uncontrolled hypertension, CKD in the setting of excessive NSAID use vs  a glomerular disease (hematuria supports).  Renal ultrasound suggestive of a chronic and not an acute process.  I will send off serologies including a sickle cell trait which can give gross hematuria. I will then follow the trend of kidney function. If worsens acutely will need a kidney biopsy in-house. If function status is stable or improves I will consider kidney biopsy in the future as OP once his blood pressure gets under better control in order to achieve a definitive diagnosis. Leading diagnosis possibility would be iga nephropathy.  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 Hypokalemia brings to  mind possible hyperaldosteronism as an etiology.  Daily headaches are likely a symptom of uncontrolled hypertension.  Has been started on Norvasc 10, clonidine and when necessary hydralazine. I am going to add on Aldactone for the hypokalemia piece, if that works may be able to decrease clonidine.  3. Hematuria - certainly does seem to be glomerular as opposed to nonglomerular- no indication of stone or urologic cause - follow for clearing 4. Anemia  - not an issue  5. Hypokalemia- brings to mind possible hyperaldo- will check aldo and renin   Cullen Lahaie A 06/17/2017, 12:51 PM

## 2017-06-17 NOTE — H&P (Signed)
History and Physical    Tyler Gilmore R Alkire ZOX:[id number] DOB: 12/24/1980 DOA: 06/16/2017  PCP: Patient, No Pcp Per   Patient coming from: Home.  I have personally briefly reviewed patient[facility]  Chief Complaint: Bleeding from his penis.  HPI: Tyler Gilmore R Hollin is a 36 y.o. male with medical history significant of hypertension, diagnosed 3 years ago and not on medical therapy who comes in the emergency department complaining of bloody discharge from his urethra seen earlier today and also has noticed bright red blood in his underwear for the past 2-3 weeks.  He denies dysuria or frequency, but states that when he initiates micturition, the urine is initially bloody.  He denies fever, chills, flank pain, abdominal pain, nausea, emesis, diarrhea, constipation, melena or hematochezia.  He also complains of frequent headaches. Denies dyspnea, chest pain, palpitations, dizziness, diaphoresis, PND, orthopnea or lower extremity edema.   ED Course: Initial vital signs in the emergency department temperature 36.7C, pulse 111, blood pressure 244/173 mmHg, respirations 18 and O2 sat 98% on room air.  He was started on a nitroglycerin infusion after clonidine 0.2 mg p.o. x1 and hydralazine 10 mg IVP x1 it were ineffective controlling his blood pressure.  His workup shows a urinalysis with large hematuria, proteinuria over 100 mg/dL and mild pyuria.  His CBC was normal.  His potassium was 2.7 mmol/L.  BUN was 31 and creatinine 2.77 mg/dL.  In July 2015 his BUN was 16 and creatinine 1.86 mg/dL.  EKG showed LVH with abnormal T waves.  His chest radiograph was unremarkable.  Review of Systems: As per HPI otherwise 10 point review of systems negative.    Past Medical History:  Diagnosis Date  . Hypertension     History reviewed. No pertinent surgical history.   reports that he has been smoking cigars.  he has never used smokeless tobacco. He reports that he drinks about 0.6  oz of alcohol per week. His drug history is not on file.  No Known Allergies  Family History  Problem Relation Age of Onset  . Hypertension Mother   . Hypertension Maternal Grandmother   . Hypertension Maternal Aunt     Prior to Admission medications   Medication Sig Start Date End Date Taking? Authorizing Provider  Aspirin-Salicylamide-Caffeine (BC HEADACHE POWDER PO) Take 1 packet by mouth daily as needed (headache).   Yes [provider]  ibuprofen (ADVIL,MOTRIN) 200 MG tablet Take 400 mg by mouth every 8 (eight) hours as needed for headache.   Yes [provider]    Physical Exam: Vitals:   06/17/17 0130 06/17/17 0131 06/17/17 0132 06/17/17 0150  BP: 134/82   (!) 157/97  Pulse: 75 86 82 90  Resp: (!) 27 (!) 24 13 (!) 23  Temp:      TempSrc:      SpO2: 96% 99% 96% 94%  Weight:      Height:        Constitutional: NAD, calm, comfortable Eyes: PERRL, lids and conjunctivae normal ENMT: Mucous membranes are moist. Posterior pharynx clear of any exudate or lesions. Neck: normal, supple, no masses, no thyromegaly Respiratory: clear to auscultation bilaterally, no wheezing, no crackles. Normal respiratory effort. No accessory muscle use.  Cardiovascular: Regular rate and rhythm, no murmurs / rubs / gallops. No extremity edema. 2+ pedal pulses. No carotid bruits.  Abdomen: Soft, no tenderness, no masses palpated. No hepatosplenomegaly. Bowel sounds positive.  Musculoskeletal: no clubbing / cyanosis. Good ROM, no contractures.  Normal muscle tone.  Skin: no rashes, lesions, ulcers on limited skin exam. Neurologic: CN 2-12 grossly intact. Sensation intact, DTR normal. Strength 5/5 in all 4.  Psychiatric: Normal judgment and insight. Alert and oriented x 4. Normal mood.    Labs on Admission: I have personally reviewed following labs and imaging studies  CBC: Recent Labs  Lab 06/16/17 2332  WBC 6.5  NEUTROABS 3.3  HGB 14.8  HCT 42.0  MCV 87.1  PLT 216    Basic Metabolic Panel: Recent Labs  Lab 06/16/17 2332 06/16/17 2342  NA 135  --   K 2.7*  --   CL 100*  --   CO2 23  --   GLUCOSE 87  --   BUN 31*  --   CREATININE 2.77*  --   CALCIUM 9.2  --   MG  --  2.2  PHOS  --  3.3   GFR: Estimated Creatinine Clearance: 37.4 mL/min (A) (by C-G formula based on SCr of 2.77 mg/dL (H)). Liver Function Tests: Recent Labs  Lab 06/16/17 2332  AST 34  ALT 28  ALKPHOS 69  BILITOT 0.4  PROT 7.3  ALBUMIN 3.9   No results for input(s): LIPASE, AMYLASE in the last 168 hours. No results for input(s): AMMONIA in the last 168 hours. Coagulation Profile: No results for input(s): INR, PROTIME in the last 168 hours. Cardiac Enzymes: Recent Labs  Lab 06/16/17 2330  TROPONINI 0.06*   BNP (last 3 results) No results for input(s): PROBNP in the last 8760 hours. HbA1C: No results for input(s): HGBA1C in the last 72 hours. CBG: No results for input(s): GLUCAP in the last 168 hours. Lipid Profile: No results for input(s): CHOL, HDL, LDLCALC, TRIG, CHOLHDL, LDLDIRECT in the last 72 hours. Thyroid Function Tests: No results for input(s): TSH, T4TOTAL, FREET4, T3FREE, THYROIDAB in the last 72 hours. Anemia Panel: No results for input(s): VITAMINB12, FOLATE, FERRITIN, TIBC, IRON, RETICCTPCT in the last 72 hours. Urine analysis:    Component Value Date/Time   COLORURINE STRAW (A) 06/16/2017 2332   APPEARANCEUR CLEAR 06/16/2017 2332   LABSPEC 1.009 06/16/2017 2332   PHURINE 5.0 06/16/2017 2332   GLUCOSEU NEGATIVE 06/16/2017 2332   HGBUR LARGE (A) 06/16/2017 2332   BILIRUBINUR NEGATIVE 06/16/2017 2332   KETONESUR NEGATIVE 06/16/2017 2332   PROTEINUR 100 (A) 06/16/2017 2332   NITRITE NEGATIVE 06/16/2017 2332   LEUKOCYTESUR NEGATIVE 06/16/2017 2332    Radiological Exams on Admission: Dg Chest 2 View  Result Date: 06/17/2017 CLINICAL DATA:  High blood pressure.  Nonsmoker. EXAM: CHEST  2 VIEW COMPARISON:  None. FINDINGS: The heart size  and mediastinal contours are within normal limits. Both lungs are clear. The visualized skeletal structures are unremarkable. IMPRESSION: No active cardiopulmonary disease. Electronically Signed   By: Burman Nieves M.D.   On: 06/17/2017 00:47    EKG: Independently reviewed. Vent. rate 88 BPM PR interval * ms QRS duration 103 ms QT/QTc 378/458 ms P-R-T axes 61 78 -72 Sinus rhythm Biatrial enlargement Left ventricular hypertrophy Abnormal T, consider ischemia, inferior leads Baseline wander in lead(s) V3 No old tracing to compare.  Assessment/Plan Principal Problem:   Hypertensive emergency Admit to stepdown/inpatient. Continue nitroglycerin infusion for now. Start oral antihypertensives while closely monitored. Check echocardiogram to evaluate concentric LVH/hypertensive cardiomyopathy  Active Problems:   Hematuria   Abnormal renal function Likely due to uncontrolled severe hypertension for the past 3+ years. Check renal ultrasound in the morning. Check ANA, rheumatoid factor, total complement to rule  out autoimmune causes of nephritic syndrome.  Consider nephrology consult.    Hypokalemia Replaced in ED. Follow-up potassium level.    Elevated troponin Likely due to demand ischemia. Trend troponin level. Check echocardiogram.    Heart murmur Check echocardiogram.    DVT prophylaxis: SCDs. Code Status: Full code. Family Communication:  Disposition Plan: Admit to SDU for BP control and further work up. Consults called:  Admission status: Inpatient/SDU.   Bobette Moavid Manuel Jazmarie Biever MD [facility] Pager [phone].  If 7PM-7AM, please contact night-coverage [email or web address] Password [email or web address]  06/17/2017, 2:27 AM

## 2017-06-17 NOTE — ED Notes (Addendum)
Date and time results received: 06/17/17 12:24  Test/Critical Value: Troponin-0.06                                 K+-2.7   Name of Provider Notified: Lemont Fillersatyana PA Orders Received? Or Actions Taken?: waiting on orders to be entered

## 2017-06-18 LAB — URINALYSIS, ROUTINE W REFLEX MICROSCOPIC
BILIRUBIN URINE: NEGATIVE
Glucose, UA: NEGATIVE mg/dL
Ketones, ur: NEGATIVE mg/dL
NITRITE: NEGATIVE
PH: 5 (ref 5.0–8.0)
Protein, ur: 100 mg/dL — AB
SPECIFIC GRAVITY, URINE: 1.012 (ref 1.005–1.030)
Squamous Epithelial / LPF: NONE SEEN

## 2017-06-18 LAB — CBC
HCT: 35.3 % — ABNORMAL LOW (ref 39.0–52.0)
Hemoglobin: 11.9 g/dL — ABNORMAL LOW (ref 13.0–17.0)
MCH: 29.9 pg (ref 26.0–34.0)
MCHC: 33.7 g/dL (ref 30.0–36.0)
MCV: 88.7 fL (ref 78.0–100.0)
PLATELETS: 183 10*3/uL (ref 150–400)
RBC: 3.98 MIL/uL — AB (ref 4.22–5.81)
RDW: 13.3 % (ref 11.5–15.5)
WBC: 7.1 10*3/uL (ref 4.0–10.5)

## 2017-06-18 LAB — BASIC METABOLIC PANEL
ANION GAP: 7 (ref 5–15)
BUN: 33 mg/dL — ABNORMAL HIGH (ref 6–20)
CALCIUM: 8.8 mg/dL — AB (ref 8.9–10.3)
CO2: 24 mmol/L (ref 22–32)
Chloride: 107 mmol/L (ref 101–111)
Creatinine, Ser: 2.95 mg/dL — ABNORMAL HIGH (ref 0.61–1.24)
GFR, EST AFRICAN AMERICAN: 29 mL/min — AB (ref >60)
GFR, EST NON AFRICAN AMERICAN: 25 mL/min — AB (ref >60)
Glucose, Bld: 115 mg/dL — ABNORMAL HIGH (ref 65–99)
POTASSIUM: 3.4 mmol/L — AB (ref 3.5–5.1)
SODIUM: 138 mmol/L (ref 135–145)

## 2017-06-18 LAB — GLOMERULAR BASEMENT MEMBRANE ANTIBODIES: GBM Ab: 3 units (ref 0–20)

## 2017-06-18 LAB — HIV ANTIBODY (ROUTINE TESTING W REFLEX): HIV SCREEN 4TH GENERATION: NONREACTIVE

## 2017-06-18 LAB — SICKLE CELL SCREEN: SICKLE CELL SCREEN: NEGATIVE

## 2017-06-18 LAB — RHEUMATOID FACTOR

## 2017-06-18 MED ORDER — CLONIDINE HCL 0.2 MG PO TABS: 0.2000 mg | ORAL_TABLET | Freq: Two times a day (BID) | ORAL | 1 refills | Status: DC

## 2017-06-18 MED ORDER — SPIRONOLACTONE 25 MG PO TABS: 25.0000 mg | ORAL_TABLET | Freq: Two times a day (BID) | ORAL | 1 refills | Status: DC

## 2017-06-18 MED ORDER — ACETAMINOPHEN 325 MG PO TABS: 650.0000 mg | ORAL_TABLET | Freq: Four times a day (QID) | ORAL | Status: AC | PRN

## 2017-06-18 MED ORDER — ASPIRIN 81 MG PO CHEW: 81.0000 mg | CHEWABLE_TABLET | Freq: Every day | ORAL | Status: AC

## 2017-06-18 MED ORDER — ACETAMINOPHEN 325 MG PO TABS
650.0000 mg | ORAL_TABLET | Freq: Four times a day (QID) | ORAL | Status: DC | PRN
  Administered 2017-06-18 – 2017-06-19 (×2): 650 mg via ORAL
  Filled 2017-06-18 (×2): qty 2

## 2017-06-18 MED ORDER — ASPIRIN 81 MG PO CHEW
81.0000 mg | CHEWABLE_TABLET | Freq: Every day | ORAL | Status: DC
  Administered 2017-06-18 – 2017-06-19 (×2): 81 mg via ORAL
  Filled 2017-06-18 (×2): qty 1

## 2017-06-18 MED ORDER — AMLODIPINE BESYLATE 10 MG PO TABS: 10.0000 mg | ORAL_TABLET | Freq: Every day | ORAL | 1 refills | Status: DC

## 2017-06-18 MED ORDER — CLONIDINE HCL 0.1 MG PO TABS
0.1000 mg | ORAL_TABLET | Freq: Two times a day (BID) | ORAL | Status: DC
  Administered 2017-06-18 – 2017-06-19 (×2): 0.1 mg via ORAL
  Filled 2017-06-18 (×2): qty 1

## 2017-06-18 NOTE — Progress Notes (Addendum)
PROGRESS NOTE    Tyler Gilmore  UJW:[id number] DOB: 04/30/1981 DOA: 06/16/2017 PCP: Patient, No Pcp Per  Brief Narrative:Tyler Gilmore is a 36 y.o. male with medical history significant of hypertension, diagnosed 3 years ago and not on medical therapy who comes in the emergency department complaining of bloody discharge from his urethra seen earlier today and also has noticed bright red blood in his urine for the past 2-3 weeks. In ED, blood pressure 244/173 mmHg, workup shows a urinalysis with large hematuria, proteinuria over 100 mg/dL and creatinine 5.622.77 mg/dL. EKG showed LVH  Assessment & Plan:     Hypertensive emergency - Patient has long-standing uncontrolled hypertension, for at least 3 years -off nitro drip -continue oral amlodipine/clonidine, aldactone added per Renal - continue when necessary IV hydralazine -Echocardiogram with normal ejection fraction and elevated end-diastolic and systolic pressures -CM consult for PCP   Hematuria-Now Resolved -Etiology unclear, could possibly be due to hypertensive emergency and progression of kidney disease -Nephritic component not excluded -Appreciate Renal input, Renal ultrasound suggestive of medical renal disease no structural abnormalities to account for hematuria -no further hematuria since last evening   Progressive AKI -His creatinine was 1.86 in July 2015, in [facility] emergency room -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 at least due to uncontrolled hypertension, unclear if there is an acute component  -creatinine 2.9 today -Renal following, consideration for biopsy   Elevated troponin -Due to increased demand from hypertensive emergency -Troponin flat trend, no evidence of ACS -2-D echocardiogram with normal ejection fraction and wall motion-  DVT prophylaxis: SCDs due to hematuria Code Status: Full code Family Communication:  None at bedside Disposition Plan: Home when stable, ok  with renal and close FU  Consultants:   Nephrology   Procedures:   Antimicrobials:    Subjective: Feels ok, urine cleared, mild HA but better  Objective: Vitals:   06/18/17 0758 06/18/17 1000 06/18/17 1100 06/18/17 1200  BP:  (!) 157/99 (!) 162/99 (!) 151/85  Pulse:  95 90 88  Resp:  17 15 19   Temp: (!) 97.5 F (36.4 C)     TempSrc: Oral     SpO2:  93% 95% 97%  Weight:      Height:        Intake/Output Summary (Last 24 hours) at 06/18/2017 1248 Last data filed at 06/18/2017 1200 Gross per 24 hour  Intake -  Output 2475 ml  Net -2475 ml   Filed Weights   06/17/17 0118 06/17/17 0227  Weight: 87.1 kg (192 lb) 87 kg (191 lb 12.8 oz)    Examination:  General exam: Appears calm and comfortable, well-built male, no distress Respiratory system: Clear to auscultation. Respiratory effort normal. Cardiovascular system: S1 & S2 heard, RRR. Systolic murmur appreciated Gastrointestinal system: Abdomen is nondistended, soft and nontender.Normal bowel sounds heard. Central nervous system: Alert and oriented. No focal neurological deficits. Extremities: Symmetric 5 x 5 power. Skin: No rashes, lesions or ulcers Psychiatry: Judgement and insight appear normal. Mood & affect appropriate.     Data Reviewed:   CBC: Recent Labs  Lab 06/16/17 2332 06/17/17 1125 06/18/17 0341  WBC 6.5 7.2 7.1  NEUTROABS 3.3 6.0  --   HGB 14.8 12.0* 11.9*  HCT 42.0 35.7* 35.3*  MCV 87.1 87.9 88.7  PLT 216 195 183   Basic Metabolic Panel: Recent Labs  Lab 06/16/17 2332 06/16/17 2342 06/17/17 1125 06/18/17 0341  NA 135  --  134* 138  K 2.7*  --  3.5 3.4*  CL 100*  --  103 107  CO2 23  --  24 24  GLUCOSE 87  --  140* 115*  BUN 31*  --  31* 33*  CREATININE 2.77*  --  2.70* 2.95*  CALCIUM 9.2  --  8.6* 8.8*  MG  --  2.2  --   --   PHOS  --  3.3  --   --    GFR: Estimated Creatinine Clearance: 35.1 mL/min (A) (by C-G formula based on SCr of 2.95 mg/dL (H)). Liver Function  Tests: Recent Labs  Lab 06/16/17 2332  AST 34  ALT 28  ALKPHOS 69  BILITOT 0.4  PROT 7.3  ALBUMIN 3.9   No results for input(s): LIPASE, AMYLASE in the last 168 hours. No results for input(s): AMMONIA in the last 168 hours. Coagulation Profile: No results for input(s): INR, PROTIME in the last 168 hours. Cardiac Enzymes: Recent Labs  Lab 06/16/17 2330 06/17/17 0619 06/17/17 1125  TROPONINI 0.06* 0.06* 0.09*   BNP (last 3 results) No results for input(s): PROBNP in the last 8760 hours. HbA1C: No results for input(s): HGBA1C in the last 72 hours. CBG: No results for input(s): GLUCAP in the last 168 hours. Lipid Profile: No results for input(s): CHOL, HDL, LDLCALC, TRIG, CHOLHDL, LDLDIRECT in the last 72 hours. Thyroid Function Tests: No results for input(s): TSH, T4TOTAL, FREET4, T3FREE, THYROIDAB in the last 72 hours. Anemia Panel: No results for input(s): VITAMINB12, FOLATE, FERRITIN, TIBC, IRON, RETICCTPCT in the last 72 hours. Urine analysis:    Component Value Date/Time   COLORURINE YELLOW 06/18/2017 0140   APPEARANCEUR CLEAR 06/18/2017 0140   LABSPEC 1.012 06/18/2017 0140   PHURINE 5.0 06/18/2017 0140   GLUCOSEU NEGATIVE 06/18/2017 0140   HGBUR LARGE (A) 06/18/2017 0140   BILIRUBINUR NEGATIVE 06/18/2017 0140   KETONESUR NEGATIVE 06/18/2017 0140   PROTEINUR 100 (A) 06/18/2017 0140   NITRITE NEGATIVE 06/18/2017 0140   LEUKOCYTESUR TRACE (A) 06/18/2017 0140   Sepsis Labs: @LABRCNTIP (procalcitonin:4,lacticidven:4)  ) Recent Results (from the past 240 hour(s))  MRSA PCR Screening     Status: None   Collection Time: 06/17/17  2:21 AM  Result Value Ref Range Status   MRSA by PCR NEGATIVE NEGATIVE Final    Comment:        The GeneXpert MRSA Assay (FDA approved for NASAL specimens only), is one component of a comprehensive MRSA colonization surveillance program. It is not intended to diagnose MRSA infection nor to guide or monitor treatment for MRSA  infections.          Radiology Studies: Dg Chest 2 View  Result Date: 06/17/2017 CLINICAL DATA:  High blood pressure.  Nonsmoker. EXAM: CHEST  2 VIEW COMPARISON:  None. FINDINGS: The heart size and mediastinal contours are within normal limits. Both lungs are clear. The visualized skeletal structures are unremarkable. IMPRESSION: No active cardiopulmonary disease. Electronically Signed   By: Burman NievesWilliam  Stevens M.D.   On: 06/17/2017 00:47   [place] Renal  Result Date: 06/17/2017 CLINICAL DATA:  Abnormal renal function. EXAM: RENAL / URINARY TRACT ULTRASOUND COMPLETE COMPARISON:  None. FINDINGS: Right Kidney: Length: 10.4 cm. Increased echogenicity of renal parenchyma is noted. No mass or hydronephrosis visualized. Left Kidney: Length: 10.4 cm. Increased echogenicity of renal parenchyma is noted. No mass or hydronephrosis visualized. Bladder: Appears normal for degree of bladder distention. IMPRESSION: Increased echogenicity of renal parenchyma is noted suggesting medical renal disease. No hydronephrosis or renal obstruction is noted. Electronically Signed  By: Lupita Raider, M.D.   On: 06/17/2017 09:26        Scheduled Meds: . amLODipine  10 mg Oral Daily  . aspirin  81 mg Oral Daily  . cloNIDine  0.2 mg Oral BID  . spironolactone  25 mg Oral BID   Continuous Infusions:   LOS: 1 day    Time spent:    Zannie Cove, MD [facility] Page via [email or web address], password [email or web address] After 7PM please contact night-coverage  06/18/2017, 12:48 PM

## 2017-06-18 NOTE — Progress Notes (Signed)
Subjective:  1800 of UOP- crt up only slightly - BP looks good.  Sickle cell screen neg- gbm neg- ESR normal- some dizziness  Objective Vital signs in last 24 hours: Vitals:   06/18/17 0758 06/18/17 1000 06/18/17 1100 06/18/17 1200  BP:  (!) 157/99 (!) 162/99 (!) 151/85  Pulse:  95 90 88  Resp:  17 15 19   Temp: (!) 97.5 F (36.4 C)     TempSrc: Oral     SpO2:  93% 95% 97%  Weight:      Height:       Weight change:   Intake/Output Summary (Last 24 hours) at 06/18/2017 1251 Last data filed at 06/18/2017 1200 Gross per 24 hour  Intake -  Output 2475 ml  Net -2475 ml    Assessment/Plan: 36 year old black male with history of creatinine 1.8  4 years ago, now 2.7-2.9. He presents with gross hematuria and hypertensive urgency 1. Elevated creatinine- this could be as simple as progressive CKD in the setting of uncontrolled hypertension, CKD in the setting of NSAID use vs a glomerular disease (hematuria supports but ESR and lack of proteinuria does not).  Renal ultrasound suggestive of a chronic and not an acute process.  serologies - negative so far and with ESR of 9 this does not support acute GN requring in house biopsy. I will consider kidney biopsy in the future as OP once his blood pressure gets under better control in order to achieve a definitive diagnosis. Leading diagnosis possibility would be igA nephropathy.  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 Hypokalemia brings to mind possible hyperaldosteronism as an etiology.   Has been started on Norvasc 10, clonidine and when necessary hydralazine. I added on Aldactone for the hypokalemia piece, if that works may be able to decrease clonidine- will order to dec to 0.1 for now as I dont love clonidine as OP BP treatment. BP is good at this place  140-160 do not want to correct overnight as will give him sxms and could worsen renal function 3. Hematuria - certainly does seem to be glomerular as opposed to nonglomerular- no indication of stone or urologic cause - follow for clearing- he says yes. Likely will pursue kidney biopsy as OP to give diagnosis  4. Anemia  - not an issue  5. Hypokalemia- brings to mind possible hyperaldo- will check aldo and renin- pending  If renal function fairly stable and BP in a good place (830-940 systolic) tomorrow I would be OK with discharge to home and follow as OP       Latonda Larrivee A    Labs: Basic Metabolic Panel: Recent Labs  Lab 06/16/17 2332 06/16/17 2342 06/17/17 1125 06/18/17 0341  NA 135  --  134* 138  K 2.7*  --  3.5 3.4*  CL 100*  --  103 107  CO2 23  --  24 24  GLUCOSE 87  --  140* 115*  BUN 31*  --  31* 33*  CREATININE 2.77*  --  2.70* 2.95*  CALCIUM 9.2  --  8.6* 8.8*  PHOS  --  3.3  --   --    Liver Function Tests: Recent Labs  Lab 06/16/17 2332  AST 34  ALT 28  ALKPHOS 69  BILITOT 0.4  PROT 7.3  ALBUMIN 3.9  No results for input(s): LIPASE, AMYLASE in the last 168 hours. No results for input(s): AMMONIA in the last 168 hours. CBC: Recent Labs  Lab 06/16/17 2332 06/17/17 1125 06/18/17 0341  WBC 6.5 7.2 7.1  NEUTROABS 3.3 6.0  --   HGB 14.8 12.0* 11.9*  HCT 42.0 35.7* 35.3*  MCV 87.1 87.9 88.7  PLT 216 195 183   Cardiac Enzymes: Recent Labs  Lab 06/16/17 2330 06/17/17 0619 06/17/17 1125  TROPONINI 0.06* 0.06* 0.09*   CBG: No results for input(s): GLUCAP in the last 168 hours.  Iron Studies: No results for input(s): IRON, TIBC, TRANSFERRIN, FERRITIN in the last 72 hours. Studies/Results: Dg Chest 2 View  Result Date: 06/17/2017 CLINICAL DATA:  High blood pressure.  Nonsmoker. EXAM: CHEST  2 VIEW COMPARISON:  None. FINDINGS: The heart size and mediastinal contours are within normal limits. Both lungs are clear. The visualized  skeletal structures are unremarkable. IMPRESSION: No active cardiopulmonary disease. Electronically Signed   By: Lucienne Capers M.D.   On: 06/17/2017 00:47   US Renal  Result Date: 06/17/2017 CLINICAL DATA:  Abnormal renal function. EXAM: RENAL / URINARY TRACT ULTRASOUND COMPLETE COMPARISON:  None. FINDINGS: Right Kidney: Length: 10.4 cm. Increased echogenicity of renal parenchyma is noted. No mass or hydronephrosis visualized. Left Kidney: Length: 10.4 cm. Increased echogenicity of renal parenchyma is noted. No mass or hydronephrosis visualized. Bladder: Appears normal for degree of bladder distention. IMPRESSION: Increased echogenicity of renal parenchyma is noted suggesting medical renal disease. No hydronephrosis or renal obstruction is noted. Electronically Signed   By: Marijo Conception, M.D.   On: 06/17/2017 09:26   Medications: Infusions:   Scheduled Medications: . amLODipine  10 mg Oral Daily  . aspirin  81 mg Oral Daily  . cloNIDine  0.2 mg Oral BID  . spironolactone  25 mg Oral BID    have reviewed scheduled and prn medications.  Physical Exam: General: non toxic- pleasant Heart: RRR Lungs: mostly clear Abdomen: soft, non tender Extremities: no edema    06/18/2017,12:51 PM  LOS: 1 day

## 2017-06-19 LAB — ANA W/REFLEX IF POSITIVE: Anti Nuclear Antibody(ANA): NEGATIVE

## 2017-06-19 LAB — BASIC METABOLIC PANEL
Anion gap: 7 (ref 5–15)
BUN: 29 mg/dL — ABNORMAL HIGH (ref 6–20)
CALCIUM: 9 mg/dL (ref 8.9–10.3)
CHLORIDE: 105 mmol/L (ref 101–111)
CO2: 26 mmol/L (ref 22–32)
CREATININE: 2.93 mg/dL — AB (ref 0.61–1.24)
GFR calc non Af Amer: 26 mL/min — ABNORMAL LOW (ref >60)
GFR, EST AFRICAN AMERICAN: 30 mL/min — AB (ref >60)
Glucose, Bld: 106 mg/dL — ABNORMAL HIGH (ref 65–99)
Potassium: 3.3 mmol/L — ABNORMAL LOW (ref 3.5–5.1)
SODIUM: 138 mmol/L (ref 135–145)

## 2017-06-19 LAB — MPO/PR-3 (ANCA) ANTIBODIES

## 2017-06-19 LAB — CBC
HCT: 36.9 % — ABNORMAL LOW (ref 39.0–52.0)
HEMOGLOBIN: 12.3 g/dL — AB (ref 13.0–17.0)
MCH: 29.9 pg (ref 26.0–34.0)
MCHC: 33.3 g/dL (ref 30.0–36.0)
MCV: 89.6 fL (ref 78.0–100.0)
Platelets: 215 10*3/uL (ref 150–400)
RBC: 4.12 MIL/uL — ABNORMAL LOW (ref 4.22–5.81)
RDW: 13.3 % (ref 11.5–15.5)
WBC: 7.8 10*3/uL (ref 4.0–10.5)

## 2017-06-19 LAB — COMPLEMENT, TOTAL: Compl, Total (CH50): >60 U/mL (ref >41)

## 2017-06-19 MED ORDER — AMLODIPINE BESYLATE 10 MG PO TABS: 10.0000 mg | ORAL_TABLET | Freq: Every evening | ORAL | 1 refills | Status: AC

## 2017-06-19 MED ORDER — CLONIDINE HCL 0.2 MG PO TABS: 0.2000 mg | ORAL_TABLET | Freq: Two times a day (BID) | ORAL | 1 refills | Status: AC

## 2017-06-19 MED ORDER — SPIRONOLACTONE 50 MG PO TABS: 50.0000 mg | ORAL_TABLET | Freq: Two times a day (BID) | ORAL | 1 refills | Status: AC

## 2017-06-19 MED ORDER — SPIRONOLACTONE 25 MG PO TABS: 50.0000 mg | ORAL_TABLET | Freq: Two times a day (BID) | ORAL | Status: DC

## 2017-06-19 MED ORDER — SPIRONOLACTONE 25 MG PO TABS
25.0000 mg | ORAL_TABLET | Freq: Once | ORAL | Status: AC
  Administered 2017-06-19: 25 mg via ORAL
  Filled 2017-06-19: qty 1

## 2017-06-19 MED ORDER — POTASSIUM CHLORIDE CRYS ER 20 MEQ PO TBCR
40.0000 meq | EXTENDED_RELEASE_TABLET | Freq: Once | ORAL | Status: AC
  Administered 2017-06-19: 40 meq via ORAL
  Filled 2017-06-19: qty 2

## 2017-06-19 NOTE — Progress Notes (Signed)
Date: June 19, 2017 Chart review for discharge needs:  None found for case management. Patient has no questions concerning post hospital care. 

## 2017-06-19 NOTE — Progress Notes (Signed)
Subjective:  2200 of UOP- crt stable - BP looks higher  Sickle cell screen neg- gbm neg- ESR normal, HIV neg- anca, ana and aldo still pending (holiday) some headache  Objective Vital signs in last 24 hours: Vitals:   06/19/17 0900 06/19/17 1000 06/19/17 1100 06/19/17 1200  BP: (!) 189/110 (!) 169/124 (!) 166/127 (!) 175/118  Pulse: 90 89 89 88  Resp: (!) 28 (!) _0 Temp:      TempSrc:      SpO2: 96% 97% 97% 95%  Weight:      Height:       Weight change:   Intake/Output Summary (Last 24 hours) at 06/19/2017 1219 Last data filed at 06/18/2017 2300 Gross per 24 hour  Intake 600 ml  Output 1200 ml  Net -600 ml    Assessment/Plan: 36 year old black male with history of creatinine 1.8  4 years ago, now 2.7-2.9. He presents with gross hematuria and hypertensive urgency 1. Elevated creatinine- this could be as simple as progressive CKD in the setting of uncontrolled hypertension, CKD in the setting of NSAID use vs a glomerular disease (hematuria supports but ESR and lack of proteinuria does not).  Renal ultrasound suggestive of a chronic process.  serologies - negative so far and with ESR of 9 this does not support acute GN requring in house biopsy. I will consider kidney biopsy in the future as OP once his blood pressure gets under better control in order to achieve a definitive diagnosis. 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 Hypokalemia brings to mind possible hyperaldosteronism as an etiology.   Has been started on Norvasc 10, clonidine and when necessary hydralazine. I added on Aldactone for the hypokalemia piece, and decreased clonidine- BP not great but OK for discharge, so not want to aggressively bring down too quickly. I have increased aldactone to 50 BID, send out also on norvasc 10  at night and clonidine 0.1 BID and I can fine tune as OP 3. Hematuria - certainly does seem to be glomerular as opposed to nonglomerular- no indication of stone or urologic cause - follow for clearing- he says yes. Likely will pursue kidney biopsy as OP in the future to give diagnosis  4. Anemia  - not an issue  5. Hypokalemia- brings to mind possible hyperaldo- will check aldo and renin- pending   I am  OK with discharge to home on aldactone 50 BID, clonidine 0.1 BID and norvasc q HS- no potassium  And I will follow as OP       Haedyn Ancrum A    Labs: Basic Metabolic Panel: Recent Labs  Lab 06/16/17 2342 06/17/17 1125 06/18/17 0341 06/19/17 0249  NA  --  134* 138 138  K  --  3.5 3.4* 3.3*  CL  --  103 107 105  CO2  --  _1 GLUCOSE  --  140* 115* 106*  BUN  --  31* 33* 29*  CREATININE  --  2.70* 2.95* 2.93*  CALCIUM  --  8.6* 8.8* 9.0  PHOS 3.3  --   --   --    Liver Function Tests: Recent Labs  Lab 06/16/17 2332  AST 34  ALT 28  ALKPHOS 69  BILITOT 0.4  PROT 7.3  ALBUMIN 3.9   No results for  input(s): LIPASE, AMYLASE in the last 168 hours. No results for input(s): AMMONIA in the last 168 hours. CBC: Recent Labs  Lab 06/16/17 2332 06/17/17 1125 06/18/17 0341 06/19/17 0249  WBC 6.5 7.2 7.1 7.8  NEUTROABS 3.3 6.0  --   --   HGB 14.8 12.0* 11.9* 12.3*  HCT 42.0 35.7* 35.3* 36.9*  MCV 87.1 87.9 88.7 89.6  PLT 216 195 183 215   Cardiac Enzymes: Recent Labs  Lab 06/16/17 2330 06/17/17 0619 06/17/17 1125  TROPONINI 0.06* 0.06* 0.09*   CBG: No results for input(s): GLUCAP in the last 168 hours.  Iron Studies: No results for input(s): IRON, TIBC, TRANSFERRIN, FERRITIN in the last 72 hours. Studies/Results: No results found. Medications: Infusions:   Scheduled Medications: . amLODipine  10 mg Oral Daily  . aspirin  81 mg Oral Daily  . cloNIDine  0.1 mg Oral BID  . spironolactone  25 mg Oral BID    have reviewed scheduled and prn  medications.  Physical Exam: General: non toxic- pleasant Heart: RRR Lungs: mostly clear Abdomen: soft, non tender Extremities: no edema    06/19/2017,12:19 PM  LOS: 2 days

## 2017-06-19 NOTE — Discharge Summary (Signed)
Physician Discharge Summary  Tyler Gilmore RXV:[id number] DOB: 1981/02/02 DOA: 06/16/2017  PCP: Patient, No Pcp Per  Admit date: 06/16/2017 Discharge date: 06/19/2017  Time spent: 35 minutes  Recommendations for Outpatient Follow-up:  1. PCP in 1 week-given info for [facility] 2. Renal Dr.Goldsborough 12/18 with labs   Discharge Diagnoses:  Principal Problem:   Hypertensive emergency   AKI    CKD 3   LVH   Hematuria   Hypokalemia   Elevated troponin   Heart murmur   Discharge Condition: stable  Diet recommendation: low sodium, heart healthy  Filed Weights   06/17/17 0118 06/17/17 0227  Weight: 87.1 kg (192 lb) 87 kg (191 lb 12.8 oz)    History of present illness:  Tyler Thilges Rogersis a 36 y.o.malewith medical history significant ofhypertension, diagnosed 3 years agoand not onmedical therapy who comes in the emergency department complaining of bloody discharge from his urethra seen earlier today and also has noticed bright red blood in his urine for the past 2-3 weeks. In ED, blood pressure 244/173 mmHg, workup shows a urinalysis with large hematuria, proteinuria over 100 mg/dL and creatinine 2.77 mg/dL. EKG showed LVH   Hospital Course:     Hypertensive emergency - Patient has long-standing uncontrolled hypertension, for at least 3 years - BP was 277/144 on admission, started on Nitroglycerin drip on admission, now off - transitioned to oral amlodipine/clonidine, aldactone added per Renal - BP improved slowly but still with room for improvement and plan for gradual lowering of BP to prevent drastic change to blood flow to brain/kidneys etc -Echocardiogram with normal ejection fraction and elevated end-diastolic and systolic pressures -CM consulted for PCP, [facility] given -FU with Dr.Goldsborough in December and further titration of BP meds as outpatient   Hematuria-Now Resolved -Etiology unclear,  could possibly be due to hypertensive emergency and progression of kidney disease -Nephritic component not excluded -Renal consulted, Renal ultrasound suggestive of medical renal disease no structural abnormalities to account for hematuria -no further hematuria since admission, resolved   Progressive AKI/now CKD3 -His creatinine was 1.86 in July 2015, in [place] emergency room -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 at least due to uncontrolled hypertension, glomerular disease suspected -creatinine 2.9 and stable, suspect this is his baseline -Followed by Renal this admission, we considered glomerular disease due to hematuria on admission, but no signif proteinuria and ESR low, serologies negative so far. -Dr.Goldsborough will follow him and consider biopsy as outpatient once BP optimally controlled -FU with Renal 12/18   Elevated troponin -Due to increased demand from hypertensive emergency -Troponin flat trend, no evidence of ACS -2-D echocardiogram with normal ejection fraction and wall motion-    Consultations:  Renal Dr.Goldsborough  Discharge Exam: Vitals:   06/19/17 1100 06/19/17 1200  BP: (!) 166/127 (!) 175/118  Pulse: 89 88  Resp: 15 17  Temp:  97.9 F (36.6 C)  SpO2: 97% 95%    General: AAOx3 Cardiovascular: S1S2/RRR Respiratory: CTAB  Discharge Instructions   Discharge Instructions    Diet - low sodium heart healthy   Complete by:  As directed    Diet - low sodium heart healthy   Complete by:  As directed    Increase activity slowly   Complete by:  As directed    Increase activity slowly   Complete by:  As directed      Current Discharge Medication List    START taking these  medications   Details  acetaminophen (TYLENOL) 325 MG tablet Take 2 tablets (650 mg total) by mouth every 6 (six) hours as needed for mild pain or headache.    amLODipine (NORVASC) 10 MG tablet Take 1 tablet (10 mg total) by mouth every  evening. Qty: 30 tablet, Refills: 1    aspirin 81 MG chewable tablet Chew 1 tablet (81 mg total) by mouth daily.    cloNIDine (CATAPRES) 0.2 MG tablet Take 1 tablet (0.2 mg total) by mouth 2 (two) times daily. Qty: 60 tablet, Refills: 1    spironolactone (ALDACTONE) 50 MG tablet Take 1 tablet (50 mg total) by mouth 2 (two) times daily. Qty: 60 tablet, Refills: 1      STOP taking these medications     Aspirin-Salicylamide-Caffeine (BC HEADACHE POWDER PO)      ibuprofen (ADVIL,MOTRIN) 200 MG tablet        No Known Allergies Follow-up Information    Corliss Parish, MD Follow up on 07/14/2017.   Specialty:  Nephrology Why:  Appt is at 8 AM- you will be called to get pre appt labs  Contact information: [place] [address] [phone]        [facility]. Schedule an appointment as soon as possible for a visit.   Contact information: [address] [place] [address] [phone]           The results of significant diagnostics from this hospitalization (including imaging, microbiology, ancillary and laboratory) are listed below for reference.    Significant Diagnostic Studies: Dg Chest 2 View  Result Date: 06/17/2017 CLINICAL DATA:  High blood pressure.  Nonsmoker. EXAM: CHEST  2 VIEW COMPARISON:  None. FINDINGS: The heart size and mediastinal contours are within normal limits. Both lungs are clear. The visualized skeletal structures are unremarkable. IMPRESSION: No active cardiopulmonary disease. Electronically Signed   By: Lucienne Capers M.D.   On: 06/17/2017 00:47   US Renal  Result Date: 06/17/2017 CLINICAL DATA:  Abnormal renal function. EXAM: RENAL / URINARY TRACT ULTRASOUND COMPLETE COMPARISON:  None. FINDINGS: Right Kidney: Length: 10.4 cm. Increased echogenicity of renal parenchyma is noted. No mass or hydronephrosis visualized. Left Kidney: Length: 10.4 cm. Increased echogenicity of  renal parenchyma is noted. No mass or hydronephrosis visualized. Bladder: Appears normal for degree of bladder distention. IMPRESSION: Increased echogenicity of renal parenchyma is noted suggesting medical renal disease. No hydronephrosis or renal obstruction is noted. Electronically Signed   By: Marijo Conception, M.D.   On: 06/17/2017 09:26    Microbiology: Recent Results (from the past 240 hour(s))  MRSA PCR Screening     Status: None   Collection Time: 06/17/17  2:21 AM  Result Value Ref Range Status   MRSA by PCR NEGATIVE NEGATIVE Final    Comment:        The GeneXpert MRSA Assay (FDA approved for NASAL specimens only), is one component of a comprehensive MRSA colonization surveillance program. It is not intended to diagnose MRSA infection nor to guide or monitor treatment for MRSA infections.      Labs: Basic Metabolic Panel: Recent Labs  Lab 06/16/17 2332 06/16/17 2342 06/17/17 1125 06/18/17 0341 06/19/17 0249  NA 135  --  134* 138 138  K 2.7*  --  3.5 3.4* 3.3*  CL 100*  --  103 107 105  CO2 23  --  24 24 26   GLUCOSE 87  --  140* 115* 106*  BUN 31*  --  31* 33* 29*  CREATININE 2.77*  --  2.70* 2.95* 2.93*  CALCIUM 9.2  --  8.6* 8.8* 9.0  MG  --  2.2  --   --   --   PHOS  --  3.3  --   --   --    Liver Function Tests: Recent Labs  Lab 06/16/17 2332  AST 34  ALT 28  ALKPHOS 69  BILITOT 0.4  PROT 7.3  ALBUMIN 3.9   No results for input(s): LIPASE, AMYLASE in the last 168 hours. No results for input(s): AMMONIA in the last 168 hours. CBC: Recent Labs  Lab 06/16/17 2332 06/17/17 1125 06/18/17 0341 06/19/17 0249  WBC 6.5 7.2 7.1 7.8  NEUTROABS 3.3 6.0  --   --   HGB 14.8 12.0* 11.9* 12.3*  HCT 42.0 35.7* 35.3* 36.9*  MCV 87.1 87.9 88.7 89.6  PLT 216 195 183 215   Cardiac Enzymes: Recent Labs  Lab 06/16/17 2330 06/17/17 0619 06/17/17 1125  TROPONINI 0.06* 0.06* 0.09*   BNP: BNP (last 3 results) No results for input(s): BNP in the last 8760  hours.  ProBNP (last 3 results) No results for input(s): PROBNP in the last 8760 hours.  CBG: No results for input(s): GLUCAP in the last 168 hours.     Signed:  Domenic Polite MD.  [facility] 06/19/2017, 1:35 PM

## 2017-06-24 LAB — ALDOSTERONE + RENIN ACTIVITY W/ RATIO
ALDO / PRA RATIO: 1.1 (ref 0.0–30.0)
ALDOSTERONE: 3 ng/dL (ref 0.0–30.0)
PRA LC/MS/MS: 2.837 ng/mL/hr (ref 0.167–5.380)

## 2018-10-02 IMAGING — CR DG CHEST 2V
2 series · 2 of 2 positions shown · non-contrast
Comparison: None.

CLINICAL DATA: High blood pressure.  Nonsmoker.

EXAM:
CHEST  2 VIEW

[w chest pa]
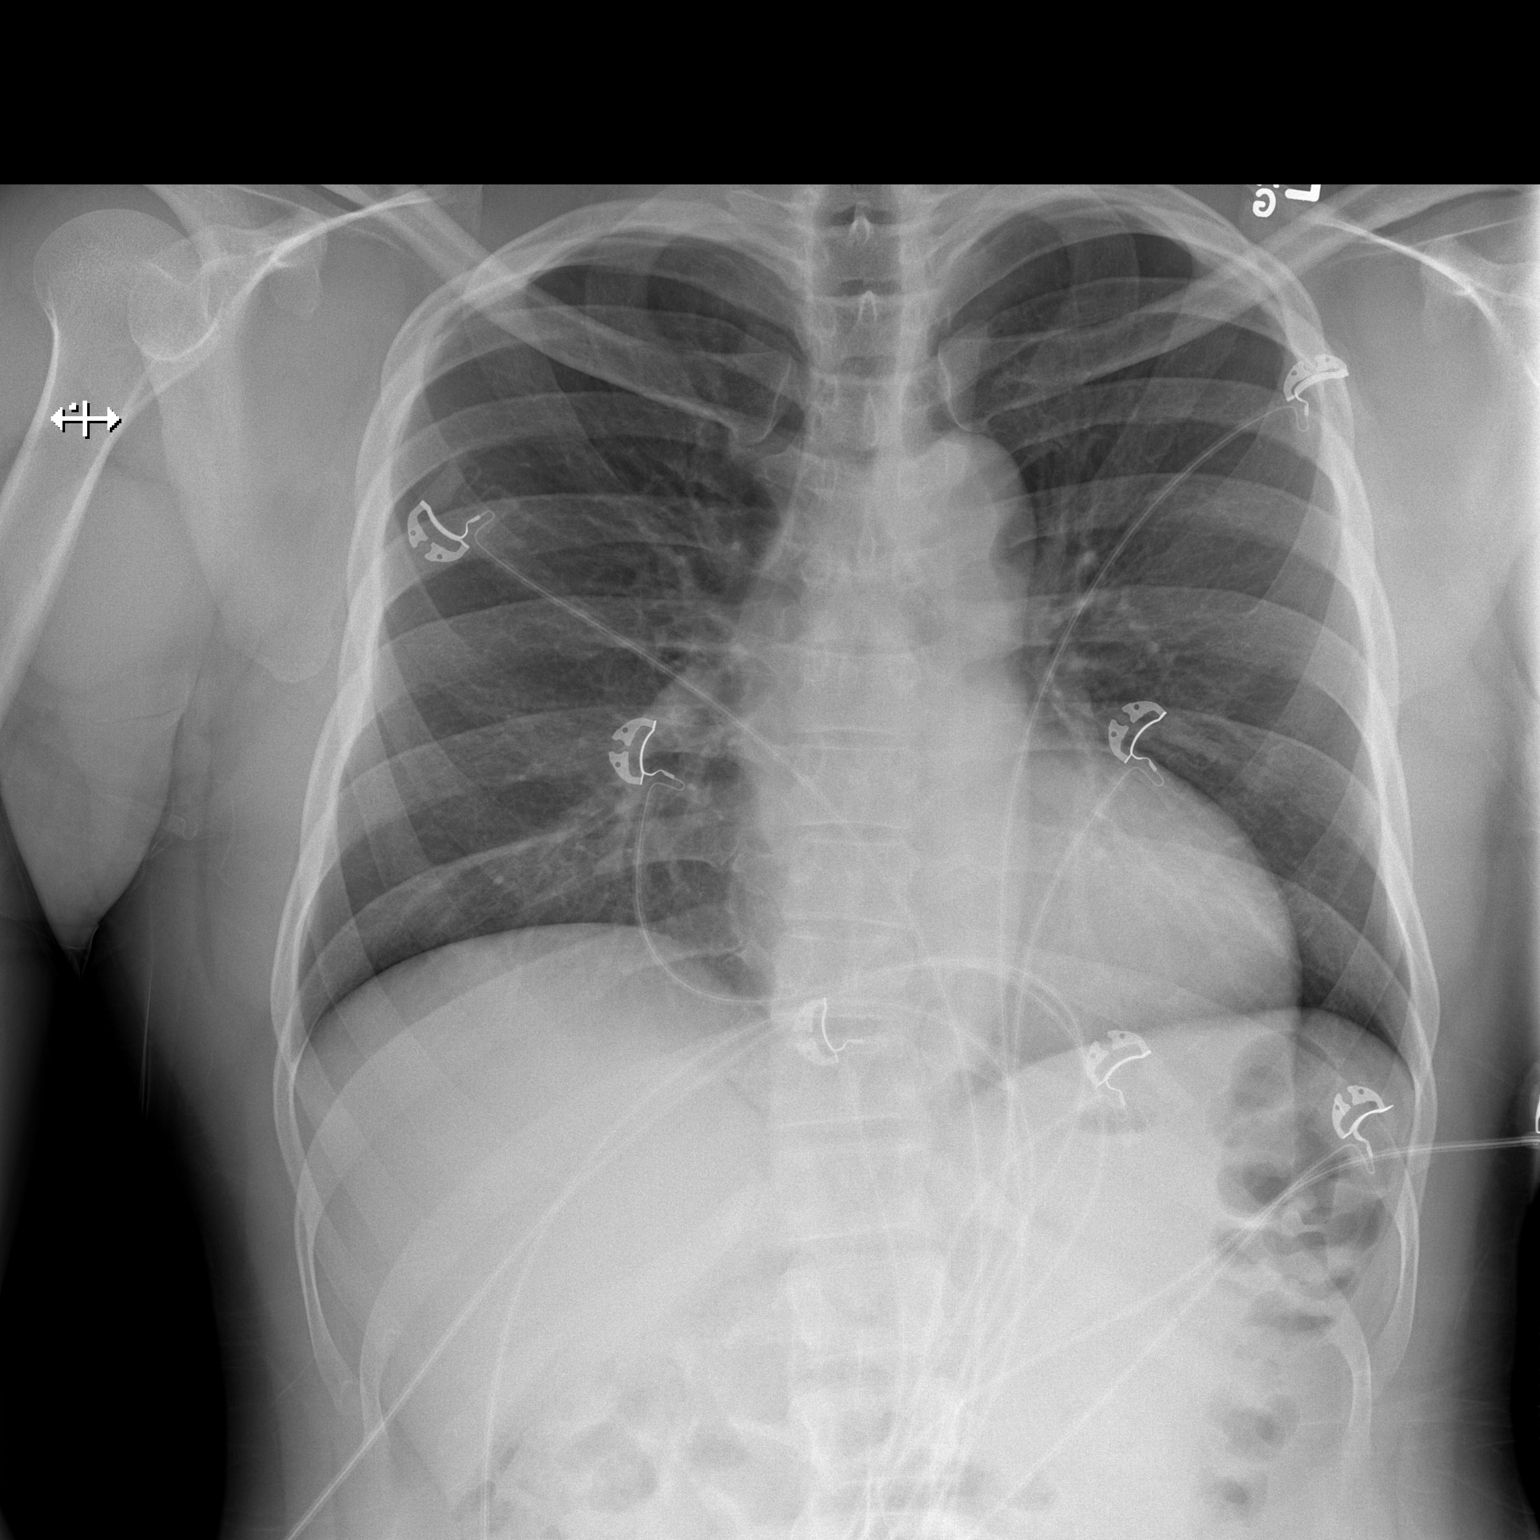

[w chest lat]
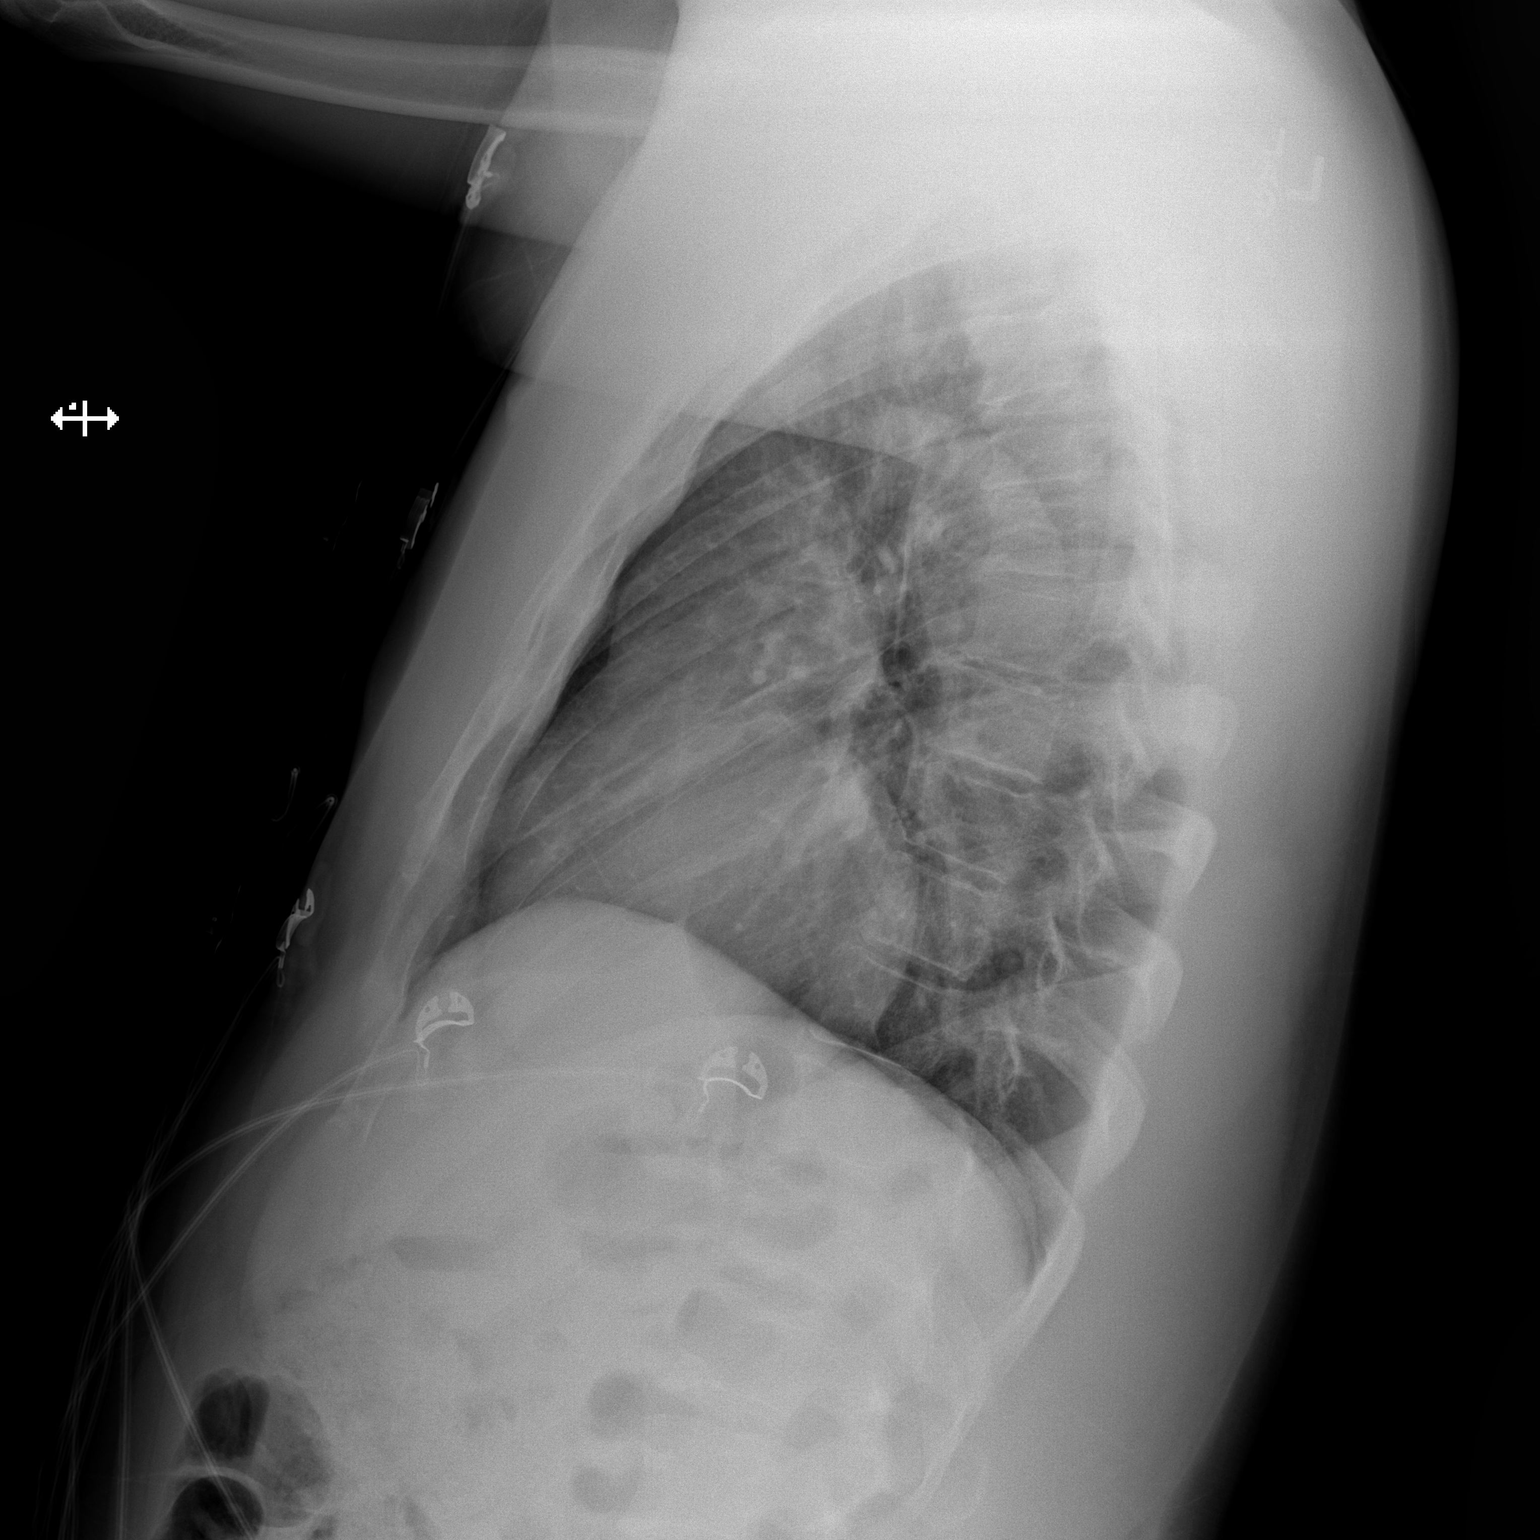

[2 of 2 positions shown; findings below may reference images not displayed]

FINDINGS: The heart size and mediastinal contours are within normal limits.
Both lungs are clear. The visualized skeletal structures are
unremarkable.
IMPRESSION: No active cardiopulmonary disease.

## 2019-02-16 IMAGING — US US RENAL
1 series · 14 of 25 positions shown · non-contrast
Comparison: None.

CLINICAL DATA: Abnormal renal function.

EXAM:
RENAL / URINARY TRACT ULTRASOUND COMPLETE

[Series 1: us renal · 0.23mm/px · 14 of 37 slices shown]
[im 1/37]
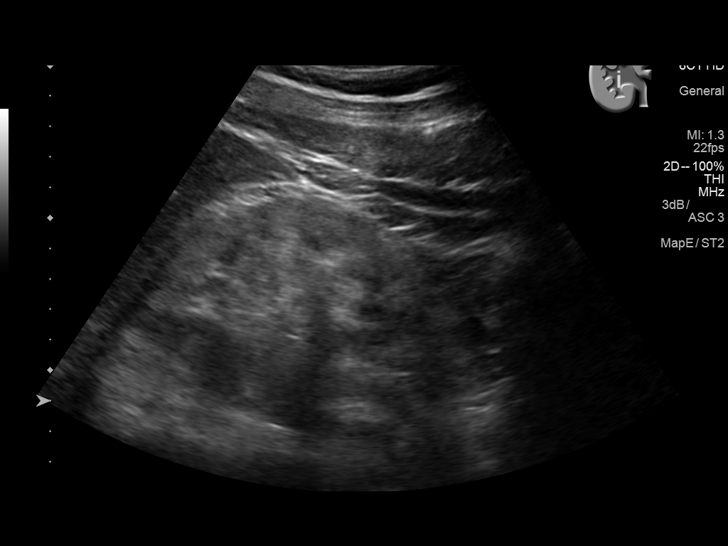
[im 4/37]
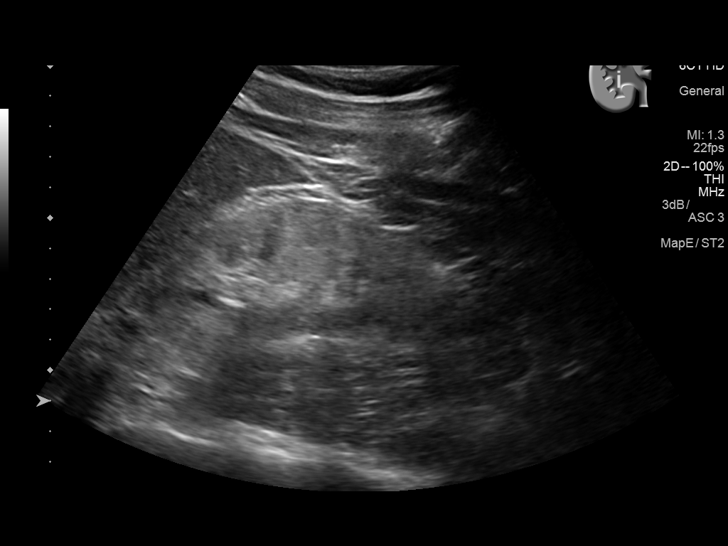
[im 7/37]
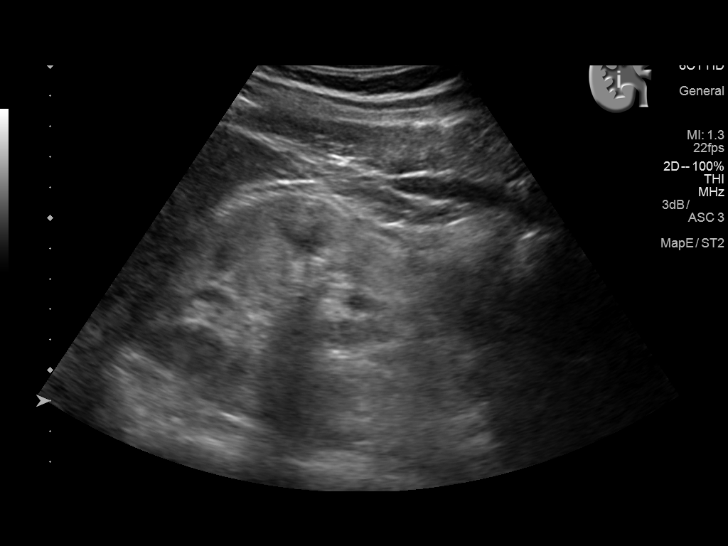
[im 10/37]
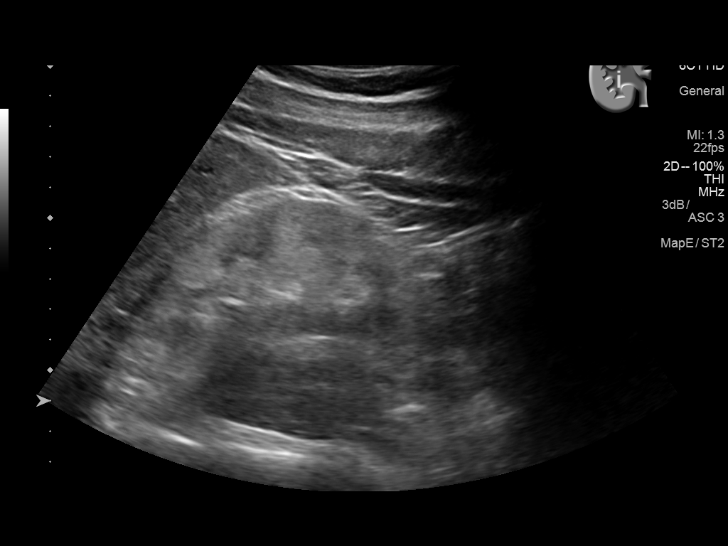
[im 13/37]
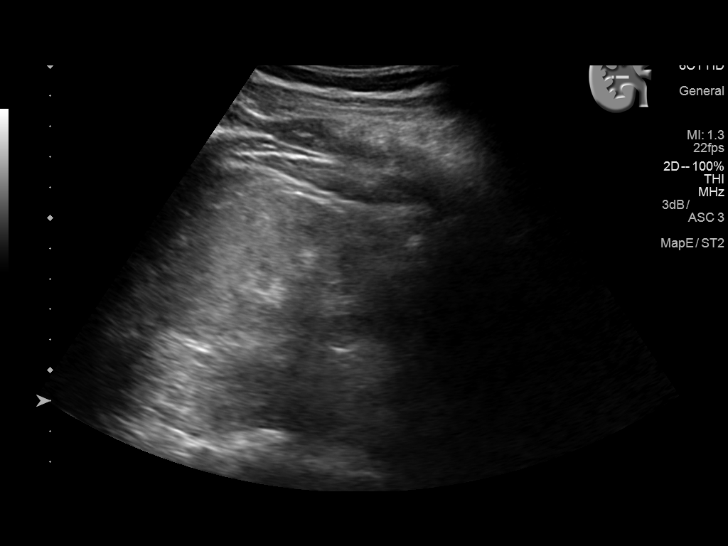
[im 14/37]
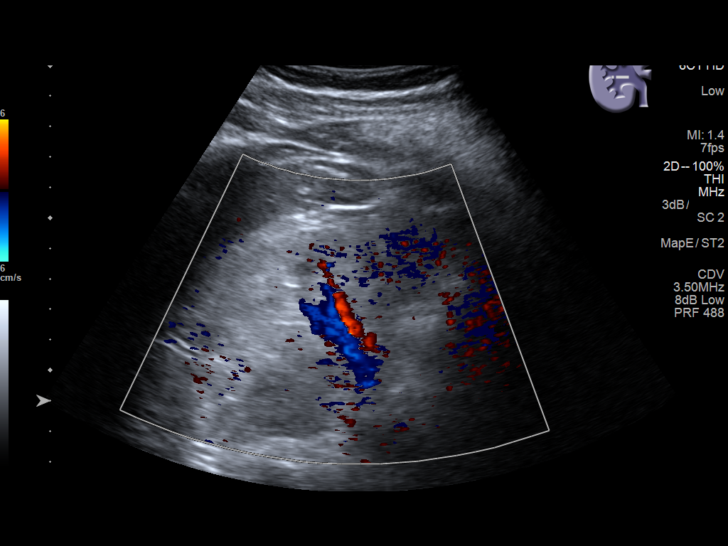
[im 17/37]
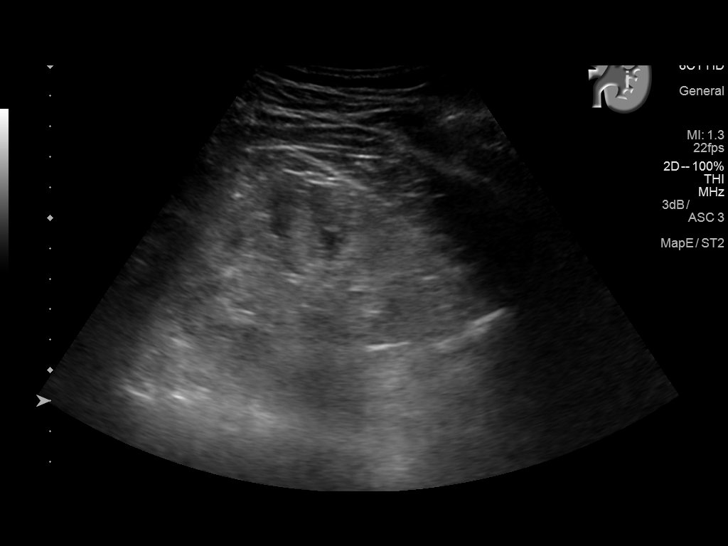
[im 20/37]
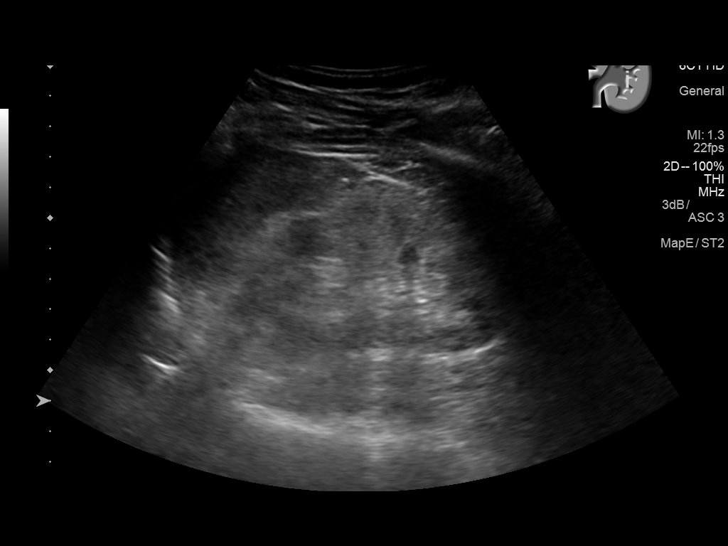
[im 23/37]
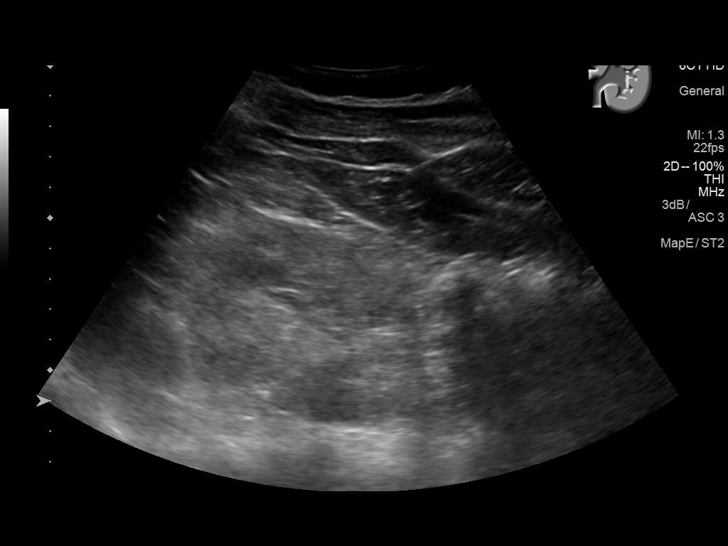
[im 25/37]
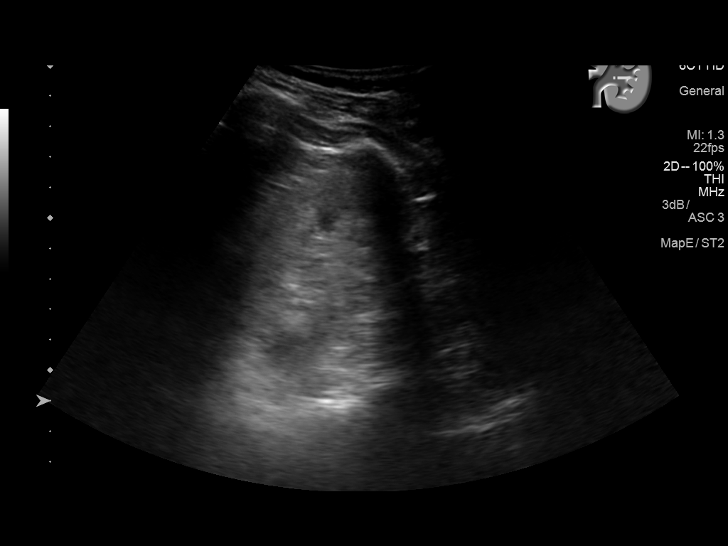
[im 28/37]
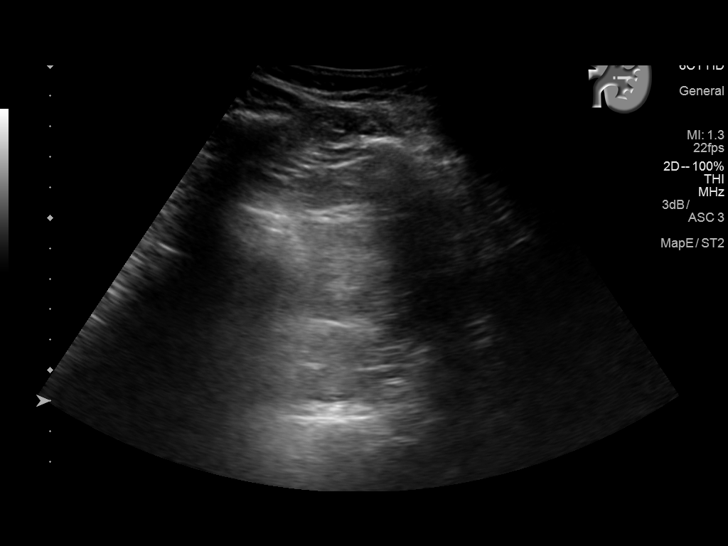
[im 31/37]
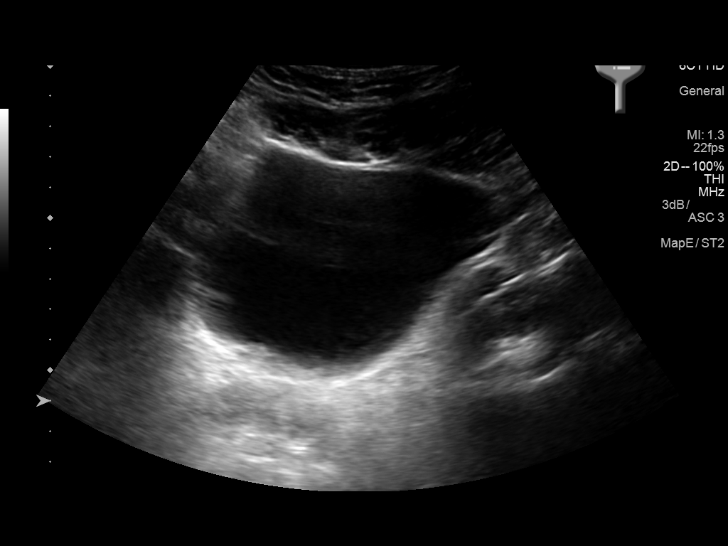
[im 34/37]
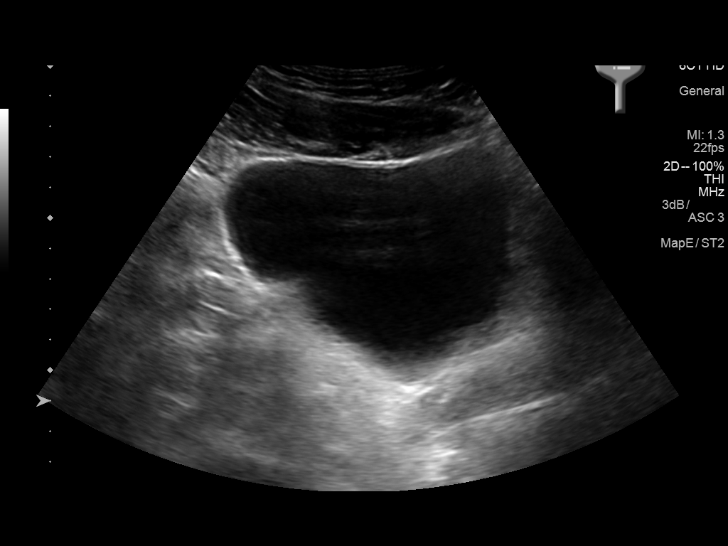
[im 37/37]
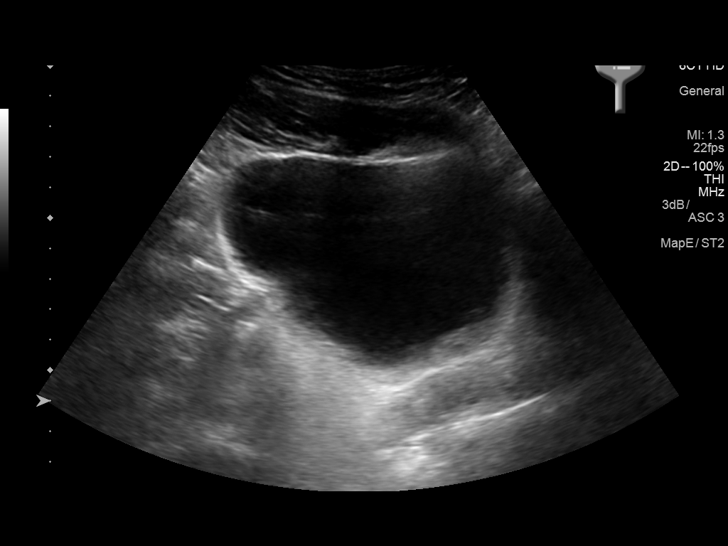

[14 of 25 positions shown; findings below may reference images not displayed]

FINDINGS: Right Kidney:

Length: 10.4 cm. Increased echogenicity of renal parenchyma is
noted. No mass or hydronephrosis visualized.

Left Kidney:

Length: 10.4 cm. Increased echogenicity of renal parenchyma is
noted. No mass or hydronephrosis visualized.

Bladder:

Appears normal for degree of bladder distention.
IMPRESSION: Increased echogenicity of renal parenchyma is noted suggesting
medical renal disease. No hydronephrosis or renal obstruction is
noted.

## 2024-07-28 DEATH — deceased
